# Patient Record
Sex: Female | Born: 1995 | Race: Black or African American | Hispanic: No | Marital: Married | Smoking: Never smoker
Health system: Southern US, Community
[De-identification: ages and names within clinical notes are randomized; demographics above are authoritative.]

## PROBLEM LIST (undated history)

## (undated) DIAGNOSIS — I10 Essential (primary) hypertension: Secondary | ICD-10-CM

## (undated) DIAGNOSIS — E079 Disorder of thyroid, unspecified: Secondary | ICD-10-CM

## (undated) HISTORY — DX: Essential (primary) hypertension: I10

## (undated) HISTORY — DX: Disorder of thyroid, unspecified: E07.9

---

## 2016-09-18 DIAGNOSIS — E0500 Thyrotoxicosis with diffuse goiter without thyrotoxic crisis or storm: Secondary | ICD-10-CM | POA: Insufficient documentation

## 2017-01-16 HISTORY — PX: THYROIDECTOMY: SHX17

## 2018-02-27 ENCOUNTER — Ambulatory Visit: Payer: 59 | Admitting: Family Medicine

## 2018-02-27 ENCOUNTER — Encounter: Payer: Self-pay | Admitting: Family Medicine

## 2018-02-27 VITALS — BP 102/70 | HR 79 | Temp 98.5°F | Ht 62.0 in | Wt 204.1 lb

## 2018-02-27 DIAGNOSIS — E890 Postprocedural hypothyroidism: Secondary | ICD-10-CM

## 2018-02-27 DIAGNOSIS — L0292 Furuncle, unspecified: Secondary | ICD-10-CM | POA: Diagnosis not present

## 2018-02-27 DIAGNOSIS — L989 Disorder of the skin and subcutaneous tissue, unspecified: Secondary | ICD-10-CM | POA: Diagnosis not present

## 2018-02-27 DIAGNOSIS — K219 Gastro-esophageal reflux disease without esophagitis: Secondary | ICD-10-CM | POA: Diagnosis not present

## 2018-02-27 MED ORDER — LEVOTHYROXINE SODIUM 137 MCG PO TABS: 137.0000 ug | ORAL_TABLET | Freq: Every day | ORAL | 1 refills | Status: DC

## 2018-02-27 MED ORDER — OMEPRAZOLE 20 MG PO CPDR: 20.0000 mg | DELAYED_RELEASE_CAPSULE | Freq: Two times a day (BID) | ORAL | 1 refills | Status: DC

## 2018-02-27 NOTE — Progress Notes (Signed)
Chief Complaint  Patient presents with  . Establish Care       New Patient Visit SUBJECTIVE: HPI: Amanda Benitez is an 22 y.o.female who is being seen for establishing care.  Post nasal drainage Duration: 3 weeks  Associated symptoms: drainage in back of throat Denies: sinus congestion, sinus pain, rhinorrhea, itchy watery eyes, ear fullness, ear pain, ear drainage, sore throat, wheezing, shortness of breath, myalgia, cough and fevers Treatment to date: Sudafed, herbal tea, peppermint tea, Mucinex, Zyrtec; none of these were helpful Sometimes eating can make it worse Sick contacts: No  Over the past 3 months, she has noticed a skin lesion over her back.  It waxes and wanes in size.  There is no pain, itching, drainage, or new topicals.  She does not believe it is getting larger and there are no skin color changes over the top of it.  Over the past several days, she is also had a painful ingrown hair in her pubic region.  She does room and believes it started after that.  There is no drainage, spreading redness, or fevers associated with this.  She has a history of hypothyroidism.  She is currently on 137 mcg of Synthroid and has currently been out for around 1 month.  The last time her thyroid levels were checked showed she was low, however she was not on the medication.  Allergies  Allergen Reactions  . Doxycycline Anaphylaxis    Past Medical History:  Diagnosis Date  . Thyroid disease    Past Surgical History:  Procedure Laterality Date  . THYROIDECTOMY  01/16/2017   Social History   Socioeconomic History  . Marital status: Married  Tobacco Use  . Smoking status: Never Smoker  . Smokeless tobacco: Never Used  Substance and Sexual Activity  . Alcohol use: Yes    Frequency: Never    Comment: rarely  . Drug use: No   Family History  Problem Relation Age of Onset  . Hypertension Mother   . Glaucoma Mother   . Hypertension Father   . Hyperlipidemia Father   .  Hyperlipidemia Sister      Current Outpatient Medications:  .  levothyroxine (SYNTHROID, LEVOTHROID) 137 MCG tablet, Take 1 tablet (137 mcg total) by mouth daily before breakfast., Disp: 90 tablet, Rfl: 1 .  omeprazole (PRILOSEC) 20 MG capsule, Take 1 capsule (20 mg total) by mouth 2 (two) times daily before a meal., Disp: 60 capsule, Rfl: 1  ROS Const: Denies fevers  Respiratory: Denies dyspnea   OBJECTIVE: BP 102/70 (BP Location: Left Arm, Patient Position: Sitting, Cuff Size: Large)   Pulse 79   Temp 98.5 F (36.9 C) (Oral)   Ht 5\' 2"  (1.575 m)   Wt 204 lb 2 oz (92.6 kg)   SpO2 99%   BMI 37.33 kg/m   Constitutional: -  VS reviewed -  Well developed, well nourished, appears stated age -  No apparent distress  Psychiatric: -  Oriented to person, place, and time -  Memory intact -  Affect and mood normal -  Fluent conversation, good eye contact -  Judgment and insight age appropriate  Eye: -  Conjunctivae clear, no discharge -  Pupils symmetric, round, reactive to light  ENMT: -  MMM    Pharynx moist, no exudate, no erythema  Neck: -  No gross swelling, no palpable masses -  Thyroid midline, not enlarged, mobile, no palpable masses  Cardiovascular: -  RRR -  No LE edema  Respiratory: -  Normal respiratory effort, no accessory muscle use, no retraction -  Breath sounds equal, no wheezes, no ronchi, no crackles  Gastrointestinal: -  Bowel sounds normal -  No tenderness, no distention, no guarding, no masses  Skin: -  Pt examined w female chaperone -  Over back on R, approx T 9, there is an obliquely lying 3.5 cm x 2 cm hard lesion that is freely moveable and well circumscribed; there is no tenderness to palpation, skin changes over, drainage, fluctuance, or excoriation -On the left mons pubis area, there is an area of indurated skin with central scaling and mild hyperemia.  It is moderately tender to palpation, no discharge or fluctuance.   ASSESSMENT/PLAN: Postoperative  hypothyroidism  Skin lesion of back - Plan: Ambulatory referral to Dermatology  Furuncle  Gastroesophageal reflux disease, esophagitis presence not specified  Patient instructed to sign release of records form from her previous PCP. Refill hypothyroidism, follow-up in 6 weeks to recheck levels with being on the medicine consistently. Refer to dermatology for further evaluation of the skin lesion. For her furuncle, recommended triple antibiotic ointment twice daily for 10 days.  Warning signs and symptoms discussed. For her discharge in the throat, recommended treating for reflux as allergies should have responded by 1 week on Zyrtec.  If no better by follow-up, will consider intranasal corticosteroid. Patient should return in 6 weeks. The patient voiced understanding and agreement to the plan.   [place], DO 02/27/18  12:01 PM

## 2018-02-27 NOTE — Patient Instructions (Addendum)
If you do not hear anything about your referral in the next 1-2 weeks, call our office and ask for an update.  Omeprazole 20 mg twice daily for 4-6 weeks to see if it is helpful for the throat issue.  Neosporin or triple antibiotic ointment twice daily for 10 days over the ingrown hair area. Be extra careful when grooming.  If you do not hear anything about your referral in the next 1-2 weeks, call our office and ask for an update.

## 2018-02-27 NOTE — Progress Notes (Signed)
Pre visit review using our clinic review tool, if applicable. No additional management support is needed unless otherwise documented below in the visit note. 

## 2018-03-24 DIAGNOSIS — E890 Postprocedural hypothyroidism: Secondary | ICD-10-CM | POA: Diagnosis not present

## 2018-03-26 DIAGNOSIS — Z124 Encounter for screening for malignant neoplasm of cervix: Secondary | ICD-10-CM | POA: Diagnosis not present

## 2018-03-30 ENCOUNTER — Other Ambulatory Visit: Payer: Self-pay | Admitting: Obstetrics and Gynecology

## 2018-03-30 DIAGNOSIS — Z0000 Encounter for general adult medical examination without abnormal findings: Secondary | ICD-10-CM | POA: Diagnosis not present

## 2018-03-30 DIAGNOSIS — N979 Female infertility, unspecified: Secondary | ICD-10-CM

## 2018-04-02 ENCOUNTER — Encounter (HOSPITAL_COMMUNITY): Payer: Self-pay | Admitting: Radiology

## 2018-04-02 ENCOUNTER — Ambulatory Visit (HOSPITAL_COMMUNITY)
Admission: RE | Admit: 2018-04-02 | Discharge: 2018-04-02 | Disposition: A | Discharge status: Home / Self Care | Payer: 59 | Source: Ambulatory Visit | Attending: Obstetrics and Gynecology | Admitting: Obstetrics and Gynecology

## 2018-04-02 DIAGNOSIS — Z3141 Encounter for fertility testing: Secondary | ICD-10-CM | POA: Diagnosis not present

## 2018-04-02 DIAGNOSIS — N979 Female infertility, unspecified: Secondary | ICD-10-CM | POA: Diagnosis present

## 2018-04-02 MED ORDER — IOPAMIDOL (ISOVUE-300) INJECTION 61%
30.0000 mL | Freq: Once | INTRAVENOUS | Status: AC | PRN
  Administered 2018-04-02: 6 mL

## 2018-04-10 ENCOUNTER — Ambulatory Visit: Payer: 59 | Admitting: Family Medicine

## 2018-04-16 DIAGNOSIS — D179 Benign lipomatous neoplasm, unspecified: Secondary | ICD-10-CM | POA: Diagnosis not present

## 2018-04-27 DIAGNOSIS — Z124 Encounter for screening for malignant neoplasm of cervix: Secondary | ICD-10-CM | POA: Diagnosis not present

## 2018-05-04 ENCOUNTER — Other Ambulatory Visit: Payer: Self-pay | Admitting: Family Medicine

## 2018-05-04 DIAGNOSIS — K219 Gastro-esophageal reflux disease without esophagitis: Secondary | ICD-10-CM

## 2018-05-04 MED ORDER — OMEPRAZOLE 20 MG PO CPDR: 20.0000 mg | DELAYED_RELEASE_CAPSULE | Freq: Two times a day (BID) | ORAL | 1 refills | Status: DC

## 2018-06-01 DIAGNOSIS — R222 Localized swelling, mass and lump, trunk: Secondary | ICD-10-CM | POA: Diagnosis not present

## 2018-06-04 DIAGNOSIS — D171 Benign lipomatous neoplasm of skin and subcutaneous tissue of trunk: Secondary | ICD-10-CM | POA: Diagnosis not present

## 2018-06-04 DIAGNOSIS — D216 Benign neoplasm of connective and other soft tissue of trunk, unspecified: Secondary | ICD-10-CM | POA: Diagnosis not present

## 2018-06-04 DIAGNOSIS — D219 Benign neoplasm of connective and other soft tissue, unspecified: Secondary | ICD-10-CM | POA: Diagnosis not present

## 2018-07-06 DIAGNOSIS — Z760 Encounter for issue of repeat prescription: Secondary | ICD-10-CM | POA: Diagnosis not present

## 2018-07-06 DIAGNOSIS — Z8639 Personal history of other endocrine, nutritional and metabolic disease: Secondary | ICD-10-CM | POA: Insufficient documentation

## 2018-07-06 DIAGNOSIS — E039 Hypothyroidism, unspecified: Secondary | ICD-10-CM | POA: Diagnosis not present

## 2018-07-07 DIAGNOSIS — Z6838 Body mass index (BMI) 38.0-38.9, adult: Secondary | ICD-10-CM | POA: Diagnosis not present

## 2018-07-07 DIAGNOSIS — E559 Vitamin D deficiency, unspecified: Secondary | ICD-10-CM | POA: Diagnosis not present

## 2018-07-07 DIAGNOSIS — N970 Female infertility associated with anovulation: Secondary | ICD-10-CM | POA: Diagnosis not present

## 2018-07-16 DIAGNOSIS — E890 Postprocedural hypothyroidism: Secondary | ICD-10-CM | POA: Diagnosis not present

## 2018-07-24 DIAGNOSIS — Z3169 Encounter for other general counseling and advice on procreation: Secondary | ICD-10-CM | POA: Diagnosis not present

## 2018-07-30 DIAGNOSIS — R109 Unspecified abdominal pain: Secondary | ICD-10-CM | POA: Diagnosis not present

## 2018-07-30 DIAGNOSIS — M549 Dorsalgia, unspecified: Secondary | ICD-10-CM | POA: Diagnosis not present

## 2018-07-30 DIAGNOSIS — R350 Frequency of micturition: Secondary | ICD-10-CM | POA: Diagnosis not present

## 2018-08-10 DIAGNOSIS — N970 Female infertility associated with anovulation: Secondary | ICD-10-CM | POA: Diagnosis not present

## 2018-08-24 DIAGNOSIS — N970 Female infertility associated with anovulation: Secondary | ICD-10-CM | POA: Diagnosis not present

## 2018-08-27 DIAGNOSIS — S161XXA Strain of muscle, fascia and tendon at neck level, initial encounter: Secondary | ICD-10-CM | POA: Diagnosis not present

## 2018-08-27 DIAGNOSIS — S63502A Unspecified sprain of left wrist, initial encounter: Secondary | ICD-10-CM | POA: Diagnosis not present

## 2018-09-08 DIAGNOSIS — S335XXD Sprain of ligaments of lumbar spine, subsequent encounter: Secondary | ICD-10-CM | POA: Diagnosis not present

## 2018-09-08 DIAGNOSIS — S63502D Unspecified sprain of left wrist, subsequent encounter: Secondary | ICD-10-CM | POA: Diagnosis not present

## 2018-09-08 DIAGNOSIS — S161XXD Strain of muscle, fascia and tendon at neck level, subsequent encounter: Secondary | ICD-10-CM | POA: Diagnosis not present

## 2018-09-18 ENCOUNTER — Encounter: Payer: Self-pay | Admitting: Family Medicine

## 2018-09-18 ENCOUNTER — Ambulatory Visit (INDEPENDENT_AMBULATORY_CARE_PROVIDER_SITE_OTHER): Payer: BLUE CROSS/BLUE SHIELD | Admitting: Family Medicine

## 2018-09-18 ENCOUNTER — Ambulatory Visit (HOSPITAL_BASED_OUTPATIENT_CLINIC_OR_DEPARTMENT_OTHER)
Admission: RE | Admit: 2018-09-18 | Discharge: 2018-09-18 | Disposition: A | Discharge status: Home / Self Care | Payer: BLUE CROSS/BLUE SHIELD | Source: Ambulatory Visit | Attending: Family Medicine | Admitting: Family Medicine

## 2018-09-18 VITALS — BP 102/70 | HR 64 | Temp 98.7°F | Ht 62.0 in | Wt 206.3 lb

## 2018-09-18 DIAGNOSIS — M25532 Pain in left wrist: Secondary | ICD-10-CM

## 2018-09-18 DIAGNOSIS — M545 Low back pain, unspecified: Secondary | ICD-10-CM

## 2018-09-18 DIAGNOSIS — M542 Cervicalgia: Secondary | ICD-10-CM | POA: Diagnosis not present

## 2018-09-18 NOTE — Progress Notes (Signed)
Pre visit review using our clinic review tool, if applicable. No additional management support is needed unless otherwise documented below in the visit note. 

## 2018-09-18 NOTE — Patient Instructions (Signed)
We will be in contact regarding your X-ray.  Heat (pad or rice pillow in microwave) over affected area, 10-15 minutes twice daily.   Ice/cold pack over area for 10-15 min twice daily.  OK to take Tylenol 1000 mg (2 extra strength tabs) or 975 mg (3 regular strength tabs) every 6 hours as needed.  If no improvement with your wrist or back in the next several weeks, let me know.  If not able to do the stretches/exercises for the neck, let me know and we will set you up with physical therapy.  I don't think ordering an MRI is going to be a covered imaging test at this time. If anything changes, let us know.  EXERCISES RANGE OF MOTION (ROM) AND STRETCHING EXERCISES  These exercises may help you when beginning to rehabilitate your issue. In order to successfully resolve your symptoms, you must improve your posture. These exercises are designed to help reduce the forward-head and rounded-shoulder posture which contributes to this condition. Your symptoms may resolve with or without further involvement from your physician, physical therapist or athletic trainer. While completing these exercises, remember:   Restoring tissue flexibility helps normal motion to return to the joints. This allows healthier, less painful movement and activity.  An effective stretch should be held for at least 20 seconds, although you may need to begin with shorter hold times for comfort.  A stretch should never be painful. You should only feel a gentle lengthening or release in the stretched tissue.  Do not do any stretch or exercise that you cannot tolerate.  STRETCH- Axial Extensors  Lie on your back on the floor. You may bend your knees for comfort. Place a rolled-up hand towel or dish towel, about 2 inches in diameter, under the part of your head that makes contact with the floor.  Gently tuck your chin, as if trying to make a "double chin," until you feel a gentle stretch at the base of your head.  Hold 15-20  seconds. Repeat 2-3 times. Complete this exercise 1 time per day.   STRETCH - Axial Extension   Stand or sit on a firm surface. Assume a good posture: chest up, shoulders drawn back, abdominal muscles slightly tense, knees unlocked (if standing) and feet hip width apart.  Slowly retract your chin so your head slides back and your chin slightly lowers. Continue to look straight ahead.  You should feel a gentle stretch in the back of your head. Be certain not to feel an aggressive stretch since this can cause headaches later.  Hold for 15-20 seconds. Repeat 2-3 times. Complete this exercise 1 time per day.  STRETCH - Cervical Side Bend   Stand or sit on a firm surface. Assume a good posture: chest up, shoulders drawn back, abdominal muscles slightly tense, knees unlocked (if standing) and feet hip width apart.  Without letting your nose or shoulders move, slowly tip your right / left ear to your shoulder until your feel a gentle stretch in the muscles on the opposite side of your neck.  Hold 15-20 seconds. Repeat 2-3 times. Complete this exercise 1-2 times per day.  STRETCH - Cervical Rotators   Stand or sit on a firm surface. Assume a good posture: chest up, shoulders drawn back, abdominal muscles slightly tense, knees unlocked (if standing) and feet hip width apart.  Keeping your eyes level with the ground, slowly turn your head until you feel a gentle stretch along the back and opposite side of your  neck.  Hold 15-20 seconds. Repeat 2-3 times. Complete this exercise 1-2 times per day.  RANGE OF MOTION - Neck Circles   Stand or sit on a firm surface. Assume a good posture: chest up, shoulders drawn back, abdominal muscles slightly tense, knees unlocked (if standing) and feet hip width apart.  Gently roll your head down and around from the back of one shoulder to the back of the other. The motion should never be forced or painful.  Repeat the motion 10-20 times, or until you feel  the neck muscles relax and loosen. Repeat 2-3 times. Complete the exercise 1-2 times per day. STRENGTHENING EXERCISES - Cervical Strain and Sprain These exercises may help you when beginning to rehabilitate your injury. They may resolve your symptoms with or without further involvement from your physician, physical therapist, or athletic trainer. While completing these exercises, remember:   Muscles can gain both the endurance and the strength needed for everyday activities through controlled exercises.  Complete these exercises as instructed by your physician, physical therapist, or athletic trainer. Progress the resistance and repetitions only as guided.  You may experience muscle soreness or fatigue, but the pain or discomfort you are trying to eliminate should never worsen during these exercises. If this pain does worsen, stop and make certain you are following the directions exactly. If the pain is still present after adjustments, discontinue the exercise until you can discuss the trouble with your clinician.  STRENGTH - Cervical Flexors, Isometric  Face a wall, standing about 6 inches away. Place a small pillow, a ball about 6-8 inches in diameter, or a folded towel between your forehead and the wall.  Slightly tuck your chin and gently push your forehead into the soft object. Push only with mild to moderate intensity, building up tension gradually. Keep your jaw and forehead relaxed.  Hold 10 to 20 seconds. Keep your breathing relaxed.  Release the tension slowly. Relax your neck muscles completely before you start the next repetition. Repeat 2-3 times. Complete this exercise 1 time per day.  STRENGTH- Cervical Lateral Flexors, Isometric   Stand about 6 inches away from a wall. Place a small pillow, a ball about 6-8 inches in diameter, or a folded towel between the side of your head and the wall.  Slightly tuck your chin and gently tilt your head into the soft object. Push only with  mild to moderate intensity, building up tension gradually. Keep your jaw and forehead relaxed.  Hold 10 to 20 seconds. Keep your breathing relaxed.  Release the tension slowly. Relax your neck muscles completely before you start the next repetition. Repeat 2-3 times. Complete this exercise 1 time per day.  STRENGTH - Cervical Extensors, Isometric   Stand about 6 inches away from a wall. Place a small pillow, a ball about 6-8 inches in diameter, or a folded towel between the back of your head and the wall.  Slightly tuck your chin and gently tilt your head back into the soft object. Push only with mild to moderate intensity, building up tension gradually. Keep your jaw and forehead relaxed.  Hold 10 to 20 seconds. Keep your breathing relaxed.  Release the tension slowly. Relax your neck muscles completely before you start the next repetition. Repeat 2-3 times. Complete this exercise 1 time per day.  POSTURE AND BODY MECHANICS CONSIDERATIONS Keeping correct posture when sitting, standing or completing your activities will reduce the stress put on different body tissues, allowing injured tissues a chance to heal and  limiting painful experiences. The following are general guidelines for improved posture. Your physician or physical therapist will provide you with any instructions specific to your needs. While reading these guidelines, remember:  The exercises prescribed by your provider will help you have the flexibility and strength to maintain correct postures.  The correct posture provides the optimal environment for your joints to work. All of your joints have less wear and tear when properly supported by a spine with good posture. This means you will experience a healthier, less painful body.  Correct posture must be practiced with all of your activities, especially prolonged sitting and standing. Correct posture is as important when doing repetitive low-stress activities (typing) as it is  when doing a single heavy-load activity (lifting).  PROLONGED STANDING WHILE SLIGHTLY LEANING FORWARD When completing a task that requires you to lean forward while standing in one place for a long time, place either foot up on a stationary 2- to 4-inch high object to help maintain the best posture. When both feet are on the ground, the low back tends to lose its slight inward curve. If this curve flattens (or becomes too large), then the back and your other joints will experience too much stress, fatigue more quickly, and can cause pain.   RESTING POSITIONS Consider which positions are most painful for you when choosing a resting position. If you have pain with flexion-based activities (sitting, bending, stooping, squatting), choose a position that allows you to rest in a less flexed posture. You would want to avoid curling into a fetal position on your side. If your pain worsens with extension-based activities (prolonged standing, working overhead), avoid resting in an extended position such as sleeping on your stomach. Most people will find more comfort when they rest with their spine in a more neutral position, neither too rounded nor too arched. Lying on a non-sagging bed on your side with a pillow between your knees, or on your back with a pillow under your knees will often provide some relief. Keep in mind, being in any one position for a prolonged period of time, no matter how correct your posture, can still lead to stiffness.  WALKING Walk with an upright posture. Your ears, shoulders, and hips should all line up. OFFICE WORK When working at a desk, create an environment that supports good, upright posture. Without extra support, muscles fatigue and lead to excessive strain on joints and other tissues.  CHAIR:  A chair should be able to slide under your desk when your back makes contact with the back of the chair. This allows you to work closely.  The chair's height should allow your eyes  to be level with the upper part of your monitor and your hands to be slightly lower than your elbows.  Body position: ? Your feet should make contact with the floor. If this is not possible, use a foot rest. ? Keep your ears over your shoulders. This will reduce stress on your neck and low back.    Wrist and Forearm Exercises Do exercises exactly as told by your health care provider and adjust them as directed. It is normal to feel mild stretching, pulling, tightness, or discomfort as you do these exercises, but you should stop right away if you feel sudden pain or your pain gets worse.   RANGE OF MOTION EXERCISES These exercises warm up your muscles and joints and improve the movement and flexibility of your injured wrist and forearm. These exercises also help to relieve  pain, numbness, and tingling. These exercises are done using the muscles in your injured wrist and forearm. Exercise A: Wrist Flexion, Active 1. With your fingers relaxed, bend your wrist forward as far as you can. 2. Hold this position for 30 seconds. Repeat 2 times. Complete this exercise 3 times per week. Exercise B: Wrist Extension, Active 1. With your fingers relaxed, bend your wrist backward as far as you can. 2. Hold this position for 30 seconds. Repeat 2 times. Complete this exercise 3 times per week. Exercise C: Supination, Active  1. Stand or sit with your arms at your sides. 2. Bend your left / right elbow to an "L" shape (90 degrees). 3. Turn your palm upward until you feel a gentle stretch on the inside of your forearm. 4. Hold this position for 30 seconds. 5. Slowly return your palm to the starting position. Repeat 2 times. Complete this exercise 3 times per week. Exercise D: Pronation, Active  1. Stand or sit with your arms at your sides. 2. Bend your left / right elbow to an "L" shape (90 degrees). 3. Turn your palm downward until you feel a gentle stretch on the top of your forearm. 4. Hold this  position for 30 seconds. 5. Slowly return your palm to the starting position. Repeat 2 times. Complete this exercise once a day.  STRETCHING EXERCISES These exercises warm up your muscles and joints and improve the movement and flexibility of your injured wrist and forearm. These exercises also help to relieve pain, numbness, and tingling. These exercises are done using your healthy wrist and forearm to help stretch the muscles in your injured wrist and forearm. Exercise E: Wrist Flexion, Passive  1. Extend your left / right arm in front of you, relax your wrist, and point your fingers downward. 2. Gently push on the back of your hand. Stop when you feel a gentle stretch on the top of your forearm. 3. Hold this position for 30 seconds. Repeat 2 times. Complete this exercise 3 times per week. Exercise F: Wrist Extension, Passive  1. Extend your left / right arm in front of you and turn your palm upward. 2. Gently pull your palm and fingertips back so your fingers point downward. You should feel a gentle stretch on the palm-side of your forearm. 3. Hold this position for 30 seconds. Repeat 2 times. Complete this exercise 3 times per week. Exercise G: Forearm Rotation, Supination, Passive 1. Sit with your left / right elbow bent to an "L" shape (90 degrees) with your forearm resting on a table. 2. Keeping your upper body and shoulder still, use your other hand to rotate your forearm palm-up until you feel a gentle to moderate stretch. 3. Hold this position for 30 seconds. 4. Slowly release the stretch and return to the starting position. Repeat 2 times. Complete this exercise 3 times per week. Exercise H: Forearm Rotation, Pronation, Passive 1. Sit with your left / right elbow bent to an "L" shape (90 degrees) with your forearm resting on a table. 2. Keeping your upper body and shoulder still, use your other hand to rotate your forearm palm-down until you feel a gentle to moderate  stretch. 3. Hold this position for 30 seconds. 4. Slowly release the stretch and return to the starting position. Repeat 2 times. Complete this exercise 3 times per week.  STRENGTHENING EXERCISES These exercises build strength and endurance in your wrist and forearm. Endurance is the ability to use your muscles for a  long time, even after they get tired. Exercise I: Wrist Flexors  1. Sit with your left / right forearm supported on a table and your hand resting palm-up over the edge of the table. Your elbow should be bent to an "L" shape (about 90 degrees) and be below the level of your shoulder. 2. Hold a 3-5 lb weight in your left / right hand. Or, hold a rubber exercise band or tube in both hands, keeping your hands at the same level and hip distance apart. There should be a slight tension in the exercise band or tube. 3. Slowly curl your hand up toward your forearm. 4. Hold this position for 3 seconds. 5. Slowly lower your hand back to the starting position. Repeat 2 times. Complete this exercise 3 times per week. Exercise J: Wrist Extensors  1. Sit with your left / right forearm supported on a table and your hand resting palm-down over the edge of the table. Your elbow should be bent to an "L" shape (about 90 degrees) and be below the level of your shoulder. 2. Hold a 3-5 lb weight in your left / right hand. Or, hold a rubber exercise band or tube in both hands, keeping your hands at the same level and hip distance apart. There should be a slight tension in the exercise band or tube. 3. Slowly curl your hand up toward your forearm. 4. Hold this position for 3 seconds. 5. Slowly lower your hand back to the starting position. Repeat 2 times. Complete this exercise 3 times per week. Exercise K: Forearm Rotation, Supination  1. Sit with your left / right forearm supported on a table and your hand resting palm-down. Your elbow should be at your side, bent to an "L" shape (about 90 degrees),  and below the level of your shoulder. Keep your wrist stable and in a neutral position throughout the exercise. 2. Gently hold a lightweight hammer with your left / right hand. 3. Without moving your elbow or wrist, slowly rotate your palm upward to a thumbs-up position. 4. Hold this position for 3 seconds. 5. Slowly return your forearm to the starting position. Repeat 2 times. Complete this exercise 3 times per week. Exercise L: Forearm Rotation, Pronation  1. Sit with your left / right forearm supported on a table and your hand resting palm-up. Your elbow should be at your side, bent to an "L" shape (about 90 degrees), and below the level of your shoulder. Keep your wrist stable. Do not allow it to move backward or forward during the exercise. 2. Gently hold a lightweight hammer with your left / right hand. 3. Without moving your elbow or wrist, slowly rotate your palm and hand upward to a thumbs-up position. 4. Hold this position for 3 seconds. 5. Slowly return your forearm to the starting position. Repeat 2 times. Complete this exercise 3 times per week. Exercise M: Grip Strengthening  1. Hold one of these items in your left / right hand: play dough, therapy putty, a dense sponge, a stress ball, or a large, rolled sock. 2. Squeeze as hard as you can without increasing pain. 3. Hold this position for 5 seconds. 4. Slowly release your grip. Repeat 2 times. Complete this exercise 3 times per week.  This information is not intended to replace advice given to you by your health care provider. Make sure you discuss any questions you have with your health care provider. Document Released: 10/16/2005 Document Revised: 08/26/2016 Document Reviewed: 08/27/2015 [employer]  Interactive Patient Education  2018 [employer] (ROM) AND STRETCHING EXERCISES - Low Back Pain Most people with lower back pain will find that their symptoms get worse with excessive bending forward  (flexion) or arching at the lower back (extension). The exercises that will help resolve your symptoms will focus on the opposite motion.  If you have pain, numbness or tingling which travels down into your buttocks, leg or foot, the goal of the therapy is for these symptoms to move closer to your back and eventually resolve. Sometimes, these leg symptoms will get better, but your lower back pain may worsen. This is often an indication of progress in your rehabilitation. Be very alert to any changes in your symptoms and the activities in which you participated in the 24 hours prior to the change. Sharing this information with your caregiver will allow him or her to most efficiently treat your condition. These exercises may help you when beginning to rehabilitate your injury. Your symptoms may resolve with or without further involvement from your physician, physical therapist or athletic trainer. While completing these exercises, remember:   Restoring tissue flexibility helps normal motion to return to the joints. This allows healthier, less painful movement and activity.  An effective stretch should be held for at least 30 seconds.  A stretch should never be painful. You should only feel a gentle lengthening or release in the stretched tissue. FLEXION RANGE OF MOTION AND STRETCHING EXERCISES:  STRETCH - Flexion, Single Knee to Chest   Lie on a firm bed or floor with both legs extended in front of you.  Keeping one leg in contact with the floor, bring your opposite knee to your chest. Hold your leg in place by either grabbing behind your thigh or at your knee.  Pull until you feel a gentle stretch in your low back. Hold 30 seconds.  Slowly release your grasp and repeat the exercise with the opposite side. Repeat 2 times. Complete this exercise 3 times per week.   STRETCH - Flexion, Double Knee to Chest  Lie on a firm bed or floor with both legs extended in front of you.  Keeping one leg in  contact with the floor, bring your opposite knee to your chest.  Tense your stomach muscles to support your back and then lift your other knee to your chest. Hold your legs in place by either grabbing behind your thighs or at your knees.  Pull both knees toward your chest until you feel a gentle stretch in your low back. Hold 30 seconds.  Tense your stomach muscles and slowly return one leg at a time to the floor. Repeat 2 times. Complete this exercise 3 times per week.   STRETCH - Low Trunk Rotation  Lie on a firm bed or floor. Keeping your legs in front of you, bend your knees so they are both pointed toward the ceiling and your feet are flat on the floor.  Extend your arms out to the side. This will stabilize your upper body by keeping your shoulders in contact with the floor.  Gently and slowly drop both knees together to one side until you feel a gentle stretch in your low back. Hold for 30 seconds.  Tense your stomach muscles to support your lower back as you bring your knees back to the starting position. Repeat the exercise to the other side. Repeat 2 times. Complete this exercise at least 3 times per week.   EXTENSION  RANGE OF MOTION AND FLEXIBILITY EXERCISES:  STRETCH - Extension, Prone on Elbows   Lie on your stomach on the floor, a bed will be too soft. Place your palms about shoulder width apart and at the height of your head.  Place your elbows under your shoulders. If this is too painful, stack pillows under your chest.  Allow your body to relax so that your hips drop lower and make contact more completely with the floor.  Hold this position for 30 seconds.  Slowly return to lying flat on the floor. Repeat 2 times. Complete this exercise 3 times per week.   RANGE OF MOTION - Extension, Prone Press Ups  Lie on your stomach on the floor, a bed will be too soft. Place your palms about shoulder width apart and at the height of your head.  Keeping your back as relaxed  as possible, slowly straighten your elbows while keeping your hips on the floor. You may adjust the placement of your hands to maximize your comfort. As you gain motion, your hands will come more underneath your shoulders.  Hold this position 30 seconds.  Slowly return to lying flat on the floor. Repeat 2 times. Complete this exercise 3 times per week.   RANGE OF MOTION- Quadruped, Neutral Spine   Assume a hands and knees position on a firm surface. Keep your hands under your shoulders and your knees under your hips. You may place padding under your knees for comfort.  Drop your head and point your tailbone toward the ground below you. This will round out your lower back like an angry cat. Hold this position for 30 seconds.  Slowly lift your head and release your tail bone so that your back sags into a large arch, like an old horse.  Hold this position for 30 seconds.  Repeat this until you feel limber in your low back.  Now, find your "sweet spot." This will be the most comfortable position somewhere between the two previous positions. This is your neutral spine. Once you have found this position, tense your stomach muscles to support your low back.  Hold this position for 30 seconds. Repeat 2 times. Complete this exercise 3 times per week.   STRENGTHENING EXERCISES - Low Back Sprain These exercises may help you when beginning to rehabilitate your injury. These exercises should be done near your "sweet spot." This is the neutral, low-back arch, somewhere between fully rounded and fully arched, that is your least painful position. When performed in this safe range of motion, these exercises can be used for people who have either a flexion or extension based injury. These exercises may resolve your symptoms with or without further involvement from your physician, physical therapist or athletic trainer. While completing these exercises, remember:   Muscles can gain both the endurance and the  strength needed for everyday activities through controlled exercises.  Complete these exercises as instructed by your physician, physical therapist or athletic trainer. Increase the resistance and repetitions only as guided.  You may experience muscle soreness or fatigue, but the pain or discomfort you are trying to eliminate should never worsen during these exercises. If this pain does worsen, stop and make certain you are following the directions exactly. If the pain is still present after adjustments, discontinue the exercise until you can discuss the trouble with your caregiver.  STRENGTHENING - Deep Abdominals, Pelvic Tilt   Lie on a firm bed or floor. Keeping your legs in front of you, bend your  knees so they are both pointed toward the ceiling and your feet are flat on the floor.  Tense your lower abdominal muscles to press your low back into the floor. This motion will rotate your pelvis so that your tail bone is scooping upwards rather than pointing at your feet or into the floor. With a gentle tension and even breathing, hold this position for 3 seconds. Repeat 2 times. Complete this exercise 3 times per week.   STRENGTHENING - Abdominals, Crunches   Lie on a firm bed or floor. Keeping your legs in front of you, bend your knees so they are both pointed toward the ceiling and your feet are flat on the floor. Cross your arms over your chest.  Slightly tip your chin down without bending your neck.  Tense your abdominals and slowly lift your trunk high enough to just clear your shoulder blades. Lifting higher can put excessive stress on the lower back and does not further strengthen your abdominal muscles.  Control your return to the starting position. Repeat 2 times. Complete this exercise 3 times per week.   STRENGTHENING - Quadruped, Opposite UE/LE Lift   Assume a hands and knees position on a firm surface. Keep your hands under your shoulders and your knees under your hips. You  may place padding under your knees for comfort.  Find your neutral spine and gently tense your abdominal muscles so that you can maintain this position. Your shoulders and hips should form a rectangle that is parallel with the floor and is not twisted.  Keeping your trunk steady, lift your right hand no higher than your shoulder and then your left leg no higher than your hip. Make sure you are not holding your breath. Hold this position for 30 seconds.  Continuing to keep your abdominal muscles tense and your back steady, slowly return to your starting position. Repeat with the opposite arm and leg. Repeat 2 times. Complete this exercise 3 times per week.   STRENGTHENING - Abdominals and Quadriceps, Straight Leg Raise   Lie on a firm bed or floor with both legs extended in front of you.  Keeping one leg in contact with the floor, bend the other knee so that your foot can rest flat on the floor.  Find your neutral spine, and tense your abdominal muscles to maintain your spinal position throughout the exercise.  Slowly lift your straight leg off the floor about 6 inches for a count of 3, making sure to not hold your breath.  Still keeping your neutral spine, slowly lower your leg all the way to the floor. Repeat this exercise with each leg 2 times. Complete this exercise 3 times per week.  POSTURE AND BODY MECHANICS CONSIDERATIONS - Low Back Sprain Keeping correct posture when sitting, standing or completing your activities will reduce the stress put on different body tissues, allowing injured tissues a chance to heal and limiting painful experiences. The following are general guidelines for improved posture.  While reading these guidelines, remember:  The exercises prescribed by your provider will help you have the flexibility and strength to maintain correct postures.  The correct posture provides the best environment for your joints to work. All of your joints have less wear and tear when  properly supported by a spine with good posture. This means you will experience a healthier, less painful body.  Correct posture must be practiced with all of your activities, especially prolonged sitting and standing. Correct posture is as important when doing  repetitive low-stress activities (typing) as it is when doing a single heavy-load activity (lifting).  RESTING POSITIONS Consider which positions are most painful for you when choosing a resting position. If you have pain with flexion-based activities (sitting, bending, stooping, squatting), choose a position that allows you to rest in a less flexed posture. You would want to avoid curling into a fetal position on your side. If your pain worsens with extension-based activities (prolonged standing, working overhead), avoid resting in an extended position such as sleeping on your stomach. Most people will find more comfort when they rest with their spine in a more neutral position, neither too rounded nor too arched. Lying on a non-sagging bed on your side with a pillow between your knees, or on your back with a pillow under your knees will often provide some relief. Keep in mind, being in any one position for a prolonged period of time, no matter how correct your posture, can still lead to stiffness.  PROPER SITTING POSTURE In order to minimize stress and discomfort on your spine, you must sit with correct posture. Sitting with good posture should be effortless for a healthy body. Returning to good posture is a gradual process. Many people can work toward this most comfortably by using various supports until they have the flexibility and strength to maintain this posture on their own. When sitting with proper posture, your ears will fall over your shoulders and your shoulders will fall over your hips. You should use the back of the chair to support your upper back. Your lower back will be in a neutral position, just slightly arched. You may place a  small pillow or folded towel at the base of your lower back for  support.  When working at a desk, create an environment that supports good, upright posture. Without extra support, muscles tire, which leads to excessive strain on joints and other tissues. Keep these recommendations in mind:  CHAIR:  A chair should be able to slide under your desk when your back makes contact with the back of the chair. This allows you to work closely.  The chair's height should allow your eyes to be level with the upper part of your monitor and your hands to be slightly lower than your elbows.  BODY POSITION  Your feet should make contact with the floor. If this is not possible, use a foot rest.  Keep your ears over your shoulders. This will reduce stress on your neck and low back.  INCORRECT SITTING POSTURES  If you are feeling tired and unable to assume a healthy sitting posture, do not slouch or slump. This puts excessive strain on your back tissues, causing more damage and pain. Healthier options include:  Using more support, like a lumbar pillow.  Switching tasks to something that requires you to be upright or walking.  Talking a brief walk.  Lying down to rest in a neutral-spine position.  PROLONGED STANDING WHILE SLIGHTLY LEANING FORWARD  When completing a task that requires you to lean forward while standing in one place for a long time, place either foot up on a stationary 2-4 inch high object to help maintain the best posture. When both feet are on the ground, the lower back tends to lose its slight inward curve. If this curve flattens (or becomes too large), then the back and your other joints will experience too much stress, tire more quickly, and can cause pain.  CORRECT STANDING POSTURES Proper standing posture should be assumed with  all daily activities, even if they only take a few moments, like when brushing your teeth. As in sitting, your ears should fall over your shoulders and your  shoulders should fall over your hips. You should keep a slight tension in your abdominal muscles to brace your spine. Your tailbone should point down to the ground, not behind your body, resulting in an over-extended swayback posture.   INCORRECT STANDING POSTURES  Common incorrect standing postures include a forward head, locked knees and/or an excessive swayback. WALKING Walk with an upright posture. Your ears, shoulders and hips should all line-up.  PROLONGED ACTIVITY IN A FLEXED POSITION When completing a task that requires you to bend forward at your waist or lean over a low surface, try to find a way to stabilize 3 out of 4 of your limbs. You can place a hand or elbow on your thigh or rest a knee on the surface you are reaching across. This will provide you more stability, so that your muscles do not tire as quickly. By keeping your knees relaxed, or slightly bent, you will also reduce stress across your lower back. CORRECT LIFTING TECHNIQUES  DO :  Assume a wide stance. This will provide you more stability and the opportunity to get as close as possible to the object which you are lifting.  Tense your abdominals to brace your spine. Bend at the knees and hips. Keeping your back locked in a neutral-spine position, lift using your leg muscles. Lift with your legs, keeping your back straight.  Test the weight of unknown objects before attempting to lift them.  Try to keep your elbows locked down at your sides in order get the best strength from your shoulders when carrying an object.     Always ask for help when lifting heavy or awkward objects. INCORRECT LIFTING TECHNIQUES DO NOT:   Lock your knees when lifting, even if it is a small object.  Bend and twist. Pivot at your feet or move your feet when needing to change directions.  Assume that you can safely pick up even a paperclip without proper posture.
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
## 2018-10-08 DIAGNOSIS — E890 Postprocedural hypothyroidism: Secondary | ICD-10-CM | POA: Diagnosis not present

## 2019-02-01 DIAGNOSIS — L600 Ingrowing nail: Secondary | ICD-10-CM | POA: Diagnosis not present

## 2019-02-01 DIAGNOSIS — M25774 Osteophyte, right foot: Secondary | ICD-10-CM | POA: Diagnosis not present

## 2019-02-08 DIAGNOSIS — E890 Postprocedural hypothyroidism: Secondary | ICD-10-CM | POA: Diagnosis not present

## 2019-04-13 ENCOUNTER — Encounter (HOSPITAL_BASED_OUTPATIENT_CLINIC_OR_DEPARTMENT_OTHER): Payer: Self-pay | Admitting: *Deleted

## 2019-04-13 ENCOUNTER — Emergency Department (HOSPITAL_BASED_OUTPATIENT_CLINIC_OR_DEPARTMENT_OTHER)
Admission: EM | Admit: 2019-04-13 | Discharge: 2019-04-13 | Disposition: A | Discharge status: Home / Self Care | Payer: BLUE CROSS/BLUE SHIELD | Attending: Emergency Medicine | Admitting: Emergency Medicine

## 2019-04-13 ENCOUNTER — Other Ambulatory Visit: Payer: Self-pay

## 2019-04-13 DIAGNOSIS — E039 Hypothyroidism, unspecified: Secondary | ICD-10-CM | POA: Insufficient documentation

## 2019-04-13 DIAGNOSIS — I10 Essential (primary) hypertension: Secondary | ICD-10-CM | POA: Diagnosis not present

## 2019-04-13 DIAGNOSIS — R002 Palpitations: Secondary | ICD-10-CM | POA: Diagnosis not present

## 2019-04-13 DIAGNOSIS — Z79899 Other long term (current) drug therapy: Secondary | ICD-10-CM | POA: Insufficient documentation

## 2019-04-13 DIAGNOSIS — Z91040 Latex allergy status: Secondary | ICD-10-CM | POA: Insufficient documentation

## 2019-04-13 LAB — CBC WITH DIFFERENTIAL/PLATELET
Abs Immature Granulocytes: 0.01 10*3/uL (ref 0.00–0.07)
Basophils Absolute: 0.1 10*3/uL (ref 0.0–0.1)
Basophils Relative: 1 %
Eosinophils Absolute: 0 10*3/uL (ref 0.0–0.5)
Eosinophils Relative: 0 %
HCT: 36.4 % (ref 36.0–46.0)
Hemoglobin: 11.3 g/dL — ABNORMAL LOW (ref 12.0–15.0)
Immature Granulocytes: 0 %
Lymphocytes Relative: 41 %
Lymphs Abs: 3.4 10*3/uL (ref 0.7–4.0)
MCH: 25.6 pg — ABNORMAL LOW (ref 26.0–34.0)
MCHC: 31 g/dL (ref 30.0–36.0)
MCV: 82.5 fL (ref 80.0–100.0)
Monocytes Absolute: 0.7 10*3/uL (ref 0.1–1.0)
Monocytes Relative: 8 %
Neutro Abs: 4.2 10*3/uL (ref 1.7–7.7)
Neutrophils Relative %: 50 %
Platelets: 396 10*3/uL (ref 150–400)
RBC: 4.41 MIL/uL (ref 3.87–5.11)
RDW: 16 % — ABNORMAL HIGH (ref 11.5–15.5)
WBC: 8.4 10*3/uL (ref 4.0–10.5)
nRBC: 0 % (ref 0.0–0.2)

## 2019-04-13 LAB — BASIC METABOLIC PANEL
Anion gap: 6 (ref 5–15)
BUN: 21 mg/dL — ABNORMAL HIGH (ref 6–20)
CO2: 23 mmol/L (ref 22–32)
Calcium: 9.3 mg/dL (ref 8.9–10.3)
Chloride: 105 mmol/L (ref 98–111)
Creatinine, Ser: 0.83 mg/dL (ref 0.44–1.00)
GFR calc Af Amer: >60 mL/min (ref >60)
GFR calc non Af Amer: >60 mL/min (ref >60)
Glucose, Bld: 81 mg/dL (ref 70–99)
Potassium: 3.9 mmol/L (ref 3.5–5.1)
Sodium: 134 mmol/L — ABNORMAL LOW (ref 135–145)

## 2019-04-13 NOTE — ED Triage Notes (Signed)
Pt c/o " palpations" x 2 weeks

## 2019-04-13 NOTE — ED Notes (Signed)
Pt on monitor 

## 2019-04-13 NOTE — Discharge Instructions (Signed)
Your hemoglobin was lower than normal but not so low that it should cause you symptoms.  You had no significant electrolyte abnormality.  Please call your family doctor tomorrow and discuss your visit here with them so they can fit you into the office to try and figure out the next best steps.  They may ask you to wear a Holter monitor to try and ensure that this is a continued sinus tachycardia.  Please return to the ED for passing out or if you start coughing up blood or start having chest pain or shortness of breath.

## 2019-04-13 NOTE — ED Notes (Signed)
Pt states thyroidectomy x 2 years ago. Denies fever, denies abdominal pain or sob

## 2019-04-13 NOTE — ED Provider Notes (Signed)
[facility] Provider Note   CSN: [id number] Arrival date & time: 04/13/19  1630    History   Chief Complaint Chief Complaint  Patient presents with  . Palpitations    HPI Amanda Benitez is a 23 y.o. female.     23 yo F with a chief complaint of palpitations.  This been going on for the past couple weeks.  She thinks she feels like when she was diagnosed with thyroid storm.  Not exertional.  No chest pain or pressure.  Just feels that her heart is racing and typically will wake her up out of sleep.  She denies caffeine use denies stimulant use.  She is on levothyroxine status post thyroid removal.  She was seen a couple days ago had an urgent care and had her thyroid labs checked that showed that she was somewhat hypothyroid.  She has not yet scheduled an appointment with her endocrinologist.  She denies any new diet medicine, denies any change in her diet.  Denies suspicious food intake denies cough congestion or fever denies vomiting or diarrhea denies hair loss denies weight loss denies heat intolerance.  The history is provided by the patient.  Palpitations  Associated symptoms: no chest pain, no dizziness, no nausea, no shortness of breath and no vomiting   Illness  Severity:  Mild Onset quality:  Sudden Duration:  2 weeks Timing:  Constant Progression:  Unchanged Chronicity:  New Associated symptoms: no chest pain, no congestion, no fever, no headaches, no myalgias, no nausea, no rhinorrhea, no shortness of breath, no vomiting and no wheezing     Past Medical History:  Diagnosis Date  . Hypertension   . Thyroid disease     Patient Active Problem List   Diagnosis Date Noted  . 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 THYROIDECTOMY  01/16/2017     OB History   No obstetric history on file.      Home Medications    Prior to Admission medications   Medication Sig Start  Date End Date Taking? Authorizing Provider  cyclobenzaprine (FLEXERIL) 10 MG tablet Take 10 mg by mouth as needed for muscle spasms.    [provider]  diclofenac (VOLTAREN) 75 MG EC tablet Take 75 mg by mouth 2 (two) times daily.    [provider]  levothyroxine (SYNTHROID, LEVOTHROID) 175 MCG tablet Take 175 mcg by mouth daily before breakfast.    [provider]  methocarbamol (ROBAXIN) 500 MG tablet Take 500 mg by mouth as needed for muscle spasms.    [provider]    Family History Family History  Problem Relation Age of Onset  . Hypertension Mother   . Glaucoma Mother   . Asthma Mother   . Hypertension Father   . Hyperlipidemia Father   . Hyperlipidemia Sister     Social History Social History   Tobacco Use  . Smoking status: Never Smoker  . Smokeless tobacco: Never Used  Substance Use Topics  . Alcohol use: Yes    Frequency: Never    Comment: rarely  . Drug use: No     Allergies   Doxycycline; Adhesive [tape]; Latex; and Minocycline   Review of Systems Review of Systems  Constitutional: Negative for chills and fever.  HENT: Negative for congestion and rhinorrhea.   Eyes: Negative for redness and visual disturbance.  Respiratory: Negative for shortness of breath and wheezing.   Cardiovascular: Positive for palpitations.  Negative for chest pain.  Gastrointestinal: Negative for nausea and vomiting.  Genitourinary: Negative for dysuria and urgency.  Musculoskeletal: Negative for arthralgias and myalgias.  Skin: Negative for pallor and wound.  Neurological: Negative for dizziness and headaches.     Physical Exam Updated Vital Signs BP 127/74   Pulse 97   Temp 99.7 F (37.6 C)   Resp 18   Ht 5\' 2"  (1.575 m)   Wt 95.3 kg   LMP 03/24/2019   SpO2 100%   BMI 38.41 kg/m   Physical Exam Vitals signs and nursing note reviewed.  Constitutional:      General: She is not in acute distress.    Appearance: She is  well-developed. She is not diaphoretic.  HENT:     Head: Normocephalic and atraumatic.  Eyes:     Pupils: Pupils are equal, round, and reactive to light.  Neck:     Musculoskeletal: Normal range of motion and neck supple.  Cardiovascular:     Rate and Rhythm: Normal rate and regular rhythm.     Heart sounds: No murmur. No friction rub. No gallop.   Pulmonary:     Effort: Pulmonary effort is normal.     Breath sounds: No wheezing or rales.  Abdominal:     General: There is no distension.     Palpations: Abdomen is soft.     Tenderness: There is no abdominal tenderness.  Musculoskeletal:        General: No tenderness.  Skin:    General: Skin is warm and dry.  Neurological:     Mental Status: She is alert and oriented to person, place, and time.  Psychiatric:        Behavior: Behavior normal.      ED Treatments / Results  Labs (all labs ordered are listed, but only abnormal results are displayed) Labs Reviewed  CBC WITH DIFFERENTIAL/PLATELET - Abnormal; Notable for the following components:      Result Value   Hemoglobin 11.3 (*)    MCH 25.6 (*)    RDW 16.0 (*)    All other components within normal limits  BASIC METABOLIC PANEL - Abnormal; Notable for the following components:   Sodium 134 (*)    BUN 21 (*)    All other components within normal limits    EKG EKG Interpretation  Date/Time:  Tuesday April 13 2019 16:41:17 EDT Ventricular Rate:  81 PR Interval:    QRS Duration: 91 QT Interval:  378 QTC Calculation: 439 R Axis:   81 Text Interpretation:  Sinus rhythm No old tracing to compare Confirmed by Deno Etienne [id number]) on 04/13/2019 4:46:57 PM   Radiology No results found.  Procedures Procedures (including critical care time)  Medications Ordered in ED Medications - No data to display   Initial Impression / Assessment and Plan / ED Course  I have reviewed the triage vital signs and the nursing notes.  Pertinent labs & imaging results that were  available during my care of the patient were reviewed by me and considered in my medical decision making (see chart for details).        23 yo F with a chief complaint of palpitations.  The patient is well-appearing and nontoxic.  I discussed with her the most common causes of sinus tachycardia which is the rhythm that she appears to be in.  She denies any illegal drug or stimulant use.  Took a pregnancy test just prior to coming which was negative.  Declining repeat  test here.  She would like basic lab work performed.  She had thyroid studies done a couple days ago that were indicative of hypothyroidism per her.  I do not have results in front of me.  Patient is not in thyroid storm clinically she is not confused she is not febrile not tachycardic.  I will obtain basic lab work here.  Have her follow-up with her family doctor.  labwork unremarkable.  D/c home.   6:06 PM:  I have discussed the diagnosis/risks/treatment options with the patient and believe the pt to be eligible for discharge home to follow-up with PCP. We also discussed returning to the ED immediately if new or worsening sx occur. We discussed the sx which are most concerning (e.g., sudden worsening pain, fever, inability to tolerate by mouth) that necessitate immediate return. Medications administered to the patient during their visit and any new prescriptions provided to the patient are listed below.  Medications given during this visit Medications - No data to display   The patient appears reasonably screen and/or stabilized for discharge and I doubt any other medical condition or other [facility] requiring further screening, evaluation, or treatment in the ED at this time prior to discharge.    Final Clinical Impressions(s) / ED Diagnoses   Final diagnoses:  Palpitations    ED Discharge Orders    None       Deno Etienne, DO 04/13/19 1806

## 2019-04-14 ENCOUNTER — Ambulatory Visit (INDEPENDENT_AMBULATORY_CARE_PROVIDER_SITE_OTHER): Payer: BLUE CROSS/BLUE SHIELD | Admitting: Family Medicine

## 2019-04-14 ENCOUNTER — Encounter: Payer: Self-pay | Admitting: Family Medicine

## 2019-04-14 DIAGNOSIS — R002 Palpitations: Secondary | ICD-10-CM | POA: Diagnosis not present

## 2019-04-14 DIAGNOSIS — J392 Other diseases of pharynx: Secondary | ICD-10-CM | POA: Diagnosis not present

## 2019-04-14 MED ORDER — FEXOFENADINE HCL 180 MG PO TABS: 180.0000 mg | ORAL_TABLET | Freq: Every day | ORAL | 5 refills | Status: DC

## 2019-04-14 NOTE — Patient Instructions (Signed)
Claritin (loratadine), Allegra (fexofenadine), Zyrtec (cetirizine) which is also equivalent to Xyzal (levocetirizine); these are listed in order from weakest to strongest. Generic, and therefore cheaper, options are in the parentheses.   Flonase (fluticasone); nasal spray that is over the counter. 2 sprays each nostril, once daily. Aim towards the same side eye when you spray.  There are available OTC, and the generic versions, which may be cheaper, are in parentheses. Show this to a pharmacist if you have trouble finding any of these items.  If this does not help, I would consider treating for reflux for the throat "rawness" feeling.   Let us know if you need anything.

## 2019-04-14 NOTE — Progress Notes (Signed)
Chief Complaint  Patient presents with  . Follow-up    Subjective: Patient is a 23 y.o. female here for ED f/u for palpitations. Due to COVID-19 pandemic, we are interacting via web portal for an electronic face-to-face visit. I verified patient's ID using 2 identifiers. Patient agreed to proceed with visit via this method. Patient is at home, I am at office. Patient and I are present for visit.   2 weeks has been having intermittent palpitations. Went to ED and had neg workup. Did not have s/s's during EKG. Lasts 5-10 min, 5-10x/d. No trigger, +associated sob. +cp that started 4 d ago. No hx of this, no caffeine/alcohol intake, no illicit drugs, no famhx. Thyroid checked and T4 was WNL.   Dry throat for 2 weeks that feels "raw". Has not tried anything for this.   ROS: Heart: + palpitations Lungs: Denies current SOB   Past Medical History:  Diagnosis Date  . Hypertension   . Thyroid disease     Objective: LMP 03/24/2019  No conversational dyspnea Age appropriate judgment and insight Nml affect and mood   Assessment and Plan: Palpitations - Plan: Holter monitor - 24 hour  Dry throat - Plan: fexofenadine (ALLEGRA) 180 MG tablet  Ck Holter monitor. 24 hrs should be sufficient given the freq of her s/s's. No extra labs worth checking at this time. Trial PO antihistamine. If no help, could consider PPI.  F/u pending above.  The patient voiced understanding and agreement to the plan.  [place], DO 04/14/19  4:14 PM

## 2019-07-08 DIAGNOSIS — E890 Postprocedural hypothyroidism: Secondary | ICD-10-CM | POA: Diagnosis not present

## 2019-12-03 ENCOUNTER — Encounter: Payer: Self-pay | Admitting: Family Medicine

## 2019-12-03 ENCOUNTER — Other Ambulatory Visit: Payer: Self-pay

## 2019-12-03 ENCOUNTER — Ambulatory Visit: Payer: 59 | Admitting: Family Medicine

## 2019-12-03 VITALS — BP 108/72 | HR 69 | Temp 97.4°F | Ht 62.0 in | Wt 216.0 lb

## 2019-12-03 DIAGNOSIS — M6752 Plica syndrome, left knee: Secondary | ICD-10-CM | POA: Diagnosis not present

## 2019-12-03 MED ORDER — MELOXICAM 15 MG PO TABS: 15.0000 mg | ORAL_TABLET | Freq: Every day | ORAL | 0 refills | Status: DC

## 2019-12-03 NOTE — Progress Notes (Signed)
Musculoskeletal Exam  Patient: Amanda Benitez DOB: 04-03-1996  DOS: 12/03/2019  SUBJECTIVE:  Chief Complaint:   Chief Complaint  Patient presents with  . Knee Injury    Injured one week ago    Amanda Benitez is a 23 y.o.  female for evaluation and treatment of L knee pain.   Onset:  2 weeks ago. Fell on her L knee to a wooden/pointy portion of bed frame Location:  Character:  burning and sharp  Progression of issue:  has worsened Associated symptoms: bruising and swelling initially that improved; nml ROM Treatment: to date has been rest, ice, OTC NSAIDS and acetaminophen.   Neurovascular symptoms: no  ROS: Musculoskeletal/Extremities: +L knee pain  Past Medical History:  Diagnosis Date  . Hypertension   . Thyroid disease     Objective: VITAL SIGNS: BP 108/72 (BP Location: Left Arm, Patient Position: Sitting, Cuff Size: Normal)   Pulse 69   Temp (!) 97.4 F (36.3 C) (Temporal)   Ht 5\' 2"  (1.575 m)   Wt 216 lb (98 kg)   SpO2 98%   BMI 39.51 kg/m  Constitutional: Well formed, well developed. No acute distress. Cardiovascular: Brisk cap refill Thorax & Lungs: No accessory muscle use Musculoskeletal: L knee.   Normal active range of motion: yes.   Normal passive range of motion: yes Tenderness to palpation: yes; over plica of medial and lat fem condyles Deformity: no Ecchymosis: no Tests positive: none Tests negative: Lachman's, varus/valgus, pat app/grind, Stine's Neurologic: Normal sensory function. No focal deficits noted. Gait nml.  Psychiatric: Normal mood. Age appropriate judgment and insight. Alert & oriented x 3.    Assessment:  Plica syndrome of knee, left - Plan: meloxicam (MOBIC) 15 MG tablet  Plan: Pt did feel some instability w her patellar upon manipulation. I will give her some knee stretches/exercises to strengthen her quad. Ice, Mobic, Tylenol for her plica. Consider injections vs nitro patches if no improvement.  F/u prn. The patient  voiced understanding and agreement to the plan.   [place], DO 12/04/19  7:50 AM

## 2019-12-03 NOTE — Patient Instructions (Signed)
Ice/cold pack over area for 10-15 min twice daily.  OK to take Tylenol 1000 mg (2 extra strength tabs) or 975 mg (3 regular strength tabs) every 6 hours as needed.  Knee Exercises It is normal to feel mild stretching, pulling, tightness, or discomfort as you do these exercises, but you should stop right away if you feel sudden pain or your pain gets worse. STRETCHING AND RANGE OF MOTION EXERCISES  These exercises warm up your muscles and joints and improve the movement and flexibility of your knee. These exercises also help to relieve pain, numbness, and tingling. Exercise A: Knee Extension, Prone  1. Lie on your abdomen on a bed. 2. Place your left / right knee just beyond the edge of the surface so your knee is not on the bed. You can put a towel under your left / right thigh just above your knee for comfort. 3. Relax your leg muscles and allow gravity to straighten your knee. You should feel a stretch behind your left / right knee. 4. Hold this position for 30 seconds. 5. Scoot up so your knee is supported between repetitions. Repeat 2 times. Complete this stretch 3 times per week. Exercise B: Knee Flexion, Active    1. Lie on your back with both knees straight. If this causes back discomfort, bend your left / right knee so your foot is flat on the floor. 2. Slowly slide your left / right heel back toward your buttocks until you feel a gentle stretch in the front of your knee or thigh. 3. Hold this position for 30 seconds. 4. Slowly slide your left / right heel back to the starting position. Repeat 2 times. Complete this exercise 3 times per week. Exercise C: Quadriceps, Prone    1. Lie on your abdomen on a firm surface, such as a bed or padded floor. 2. Bend your left / right knee and hold your ankle. If you cannot reach your ankle or pant leg, loop a belt around your foot and grab the belt instead. 3. Gently pull your heel toward your buttocks. Your knee should not slide out to  the side. You should feel a stretch in the front of your thigh and knee. 4. Hold this position for 30 seconds. Repeat 2 times. Complete this stretch 3 times per week. Exercise D: Hamstring, Supine  1. Lie on your back. 2. Loop a belt or towel over the ball of your left / right foot. The ball of your foot is on the walking surface, right under your toes. 3. Straighten your left / right knee and slowly pull on the belt to raise your leg until you feel a gentle stretch behind your knee. ? Do not let your left / right knee bend while you do this. ? Keep your other leg flat on the floor. 4. Hold this position for 30 seconds. Repeat 2 times. Complete this stretch 3 times per week. STRENGTHENING EXERCISES  These exercises build strength and endurance in your knee. Endurance is the ability to use your muscles for a long time, even after they get tired. Exercise E: Quadriceps, Isometric    1. Lie on your back with your left / right leg extended and your other knee bent. Put a rolled towel or small pillow under your knee if told by your health care provider. 2. Slowly tense the muscles in the front of your left / right thigh. You should see your kneecap slide up toward your hip or see increased  dimpling just above the knee. This motion will push the back of the knee toward the floor. 3. For 3 seconds, keep the muscle as tight as you can without increasing your pain. 4. Relax the muscles slowly and completely. Repeat for 10 total reps Repeat 2 ti mes. Complete this exercise 3 times per week. Exercise F: Straight Leg Raises - Quadriceps  1. Lie on your back with your left / right leg extended and your other knee bent. 2. Tense the muscles in the front of your left / right thigh. You should see your kneecap slide up or see increased dimpling just above the knee. Your thigh may even shake a bit. 3. Keep these muscles tight as you raise your leg 4-6 inches (10-15 cm) off the floor. Do not let your knee  bend. 4. Hold this position for 3 seconds. 5. Keep these muscles tense as you lower your leg. 6. Relax your muscles slowly and completely after each repetition. 10 total reps. Repeat 2 times. Complete this exercise 3 times per week.  Exercise G: Hamstring Curls    If told by your health care provider, do this exercise while wearing ankle weights. Begin with 5 lb weights (optional). Then increase the weight by 1 lb (0.5 kg) increments. Do not wear ankle weights that are more than 20 lbs to start with. 1. Lie on your abdomen with your legs straight. 2. Bend your left / right knee as far as you can without feeling pain. Keep your hips flat against the floor. 3. Hold this position for 3 seconds. 4. Slowly lower your leg to the starting position. Repeat for 10 reps.  Repeat 2 times. Complete this exercise 3 times per week. Exercise H: Squats (Quadriceps)  1. Stand in front of a table, with your feet and knees pointing straight ahead. You may rest your hands on the table for balance but not for support. 2. Slowly bend your knees and lower your hips like you are going to sit in a chair. ? Keep your weight over your heels, not over your toes. ? Keep your lower legs upright so they are parallel with the table legs. ? Do not let your hips go lower than your knees. ? Do not bend lower than told by your health care provider. ? If your knee pain increases, do not bend as low. 3. Hold the squat position for 1 second. 4. Slowly push with your legs to return to standing. Do not use your hands to pull yourself to standing. Repeat 2 times. Complete this exercise 3 times per week. Exercise I: Wall Slides (Quadriceps)    1. Lean your back against a smooth wall or door while you walk your feet out 18-24 inches (46-61 cm) from it. 2. Place your feet hip-width apart. 3. Slowly slide down the wall or door until your knees Repeat 2 times. Complete this exercise every other day. 4. Exercise K: Straight Leg  Raises - Hip Abductors  1. Lie on your side with your left / right leg in the top position. Lie so your head, shoulder, knee, and hip line up. You may bend your bottom knee to help you keep your balance. 2. Roll your hips slightly forward so your hips are stacked directly over each other and your left / right knee is facing forward. 3. Leading with your heel, lift your top leg 4-6 inches (10-15 cm). You should feel the muscles in your outer hip lifting. ? Do not let your foot  drift forward. ? Do not let your knee roll toward the ceiling. 4. Hold this position for 3 seconds. 5. Slowly return your leg to the starting position. 6. Let your muscles relax completely after each repetition. 10 total reps. Repeat 2 times. Complete this exercise 3 times per week. Exercise J: Straight Leg Raises - Hip Extensors  1. Lie on your abdomen on a firm surface. You can put a pillow under your hips if that is more comfortable. 2. Tense the muscles in your buttocks and lift your left / right leg about 4-6 inches (10-15 cm). Keep your knee straight as you lift your leg. 3. Hold this position for 3 seconds. 4. Slowly lower your leg to the starting position. 5. Let your leg relax completely after each repetition. Repeat 2 times. Complete this exercise 3 times per week. Document Released: 10/16/2005 Document Revised: 08/26/2016 Document Reviewed: 10/08/2015 Elsevier Interactive Patient Education  2017 [employer].

## 2019-12-04 ENCOUNTER — Encounter: Payer: Self-pay | Admitting: Family Medicine

## 2020-01-04 ENCOUNTER — Other Ambulatory Visit: Payer: Self-pay | Admitting: Family Medicine

## 2020-01-04 DIAGNOSIS — M6752 Plica syndrome, left knee: Secondary | ICD-10-CM

## 2020-02-16 IMAGING — RF DG HYSTEROGRAM
4 series · 4 of 4 positions shown · IV contrast (omnipaque)
Comparison: None.

CLINICAL DATA: Infertility evaluation

EXAM:
HYSTEROSALPINGOGRAM
TECHNIQUE: Following cleansing of the cervix and vagina with Betadine solution,
a hysterosalpingogram was performed using a 5-French
hysterosalpingogram catheter and Omnipaque 300 contrast. The patient
tolerated the examination without difficulty.

[Series 1: run · 1 of 1 slices shown (1 of 4)]
[im 1/1]
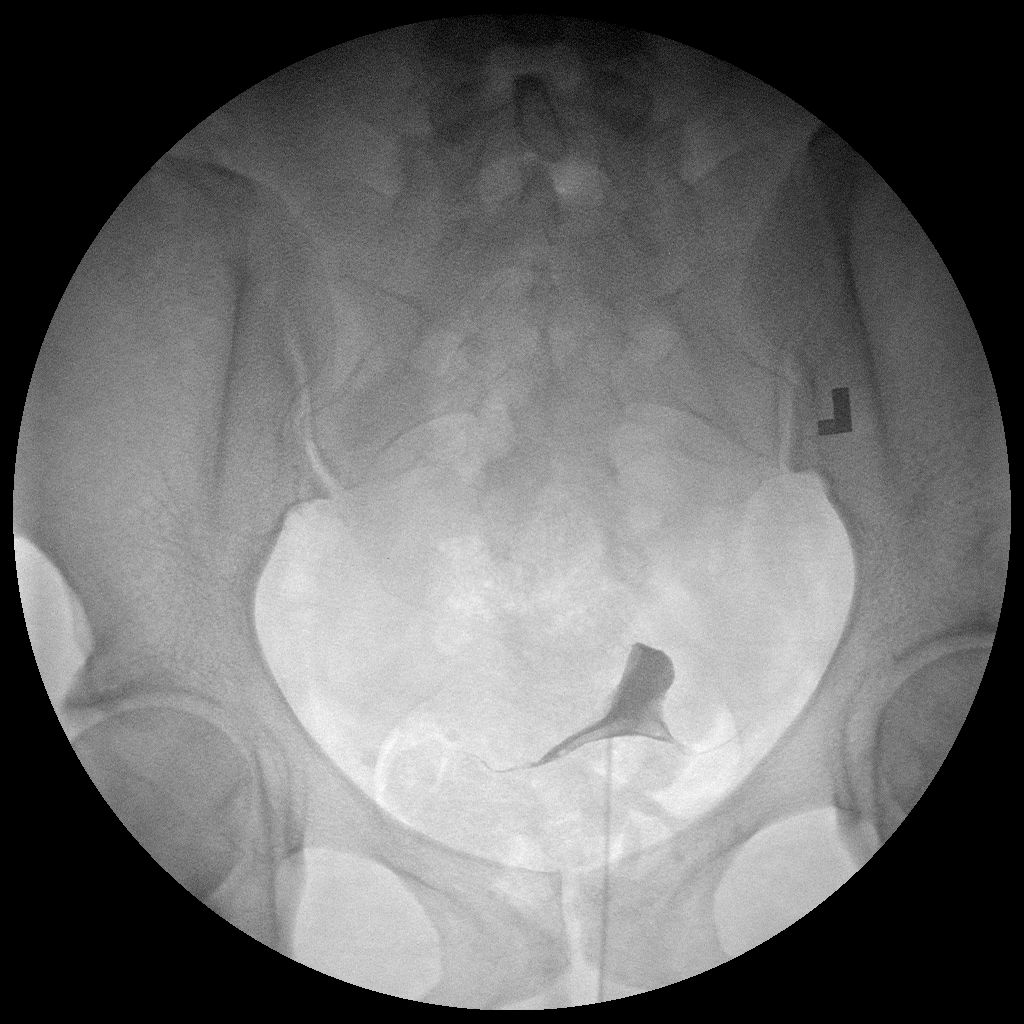

[Series 2: run · 1 of 1 slices shown (2 of 4)]
[im 1/1]
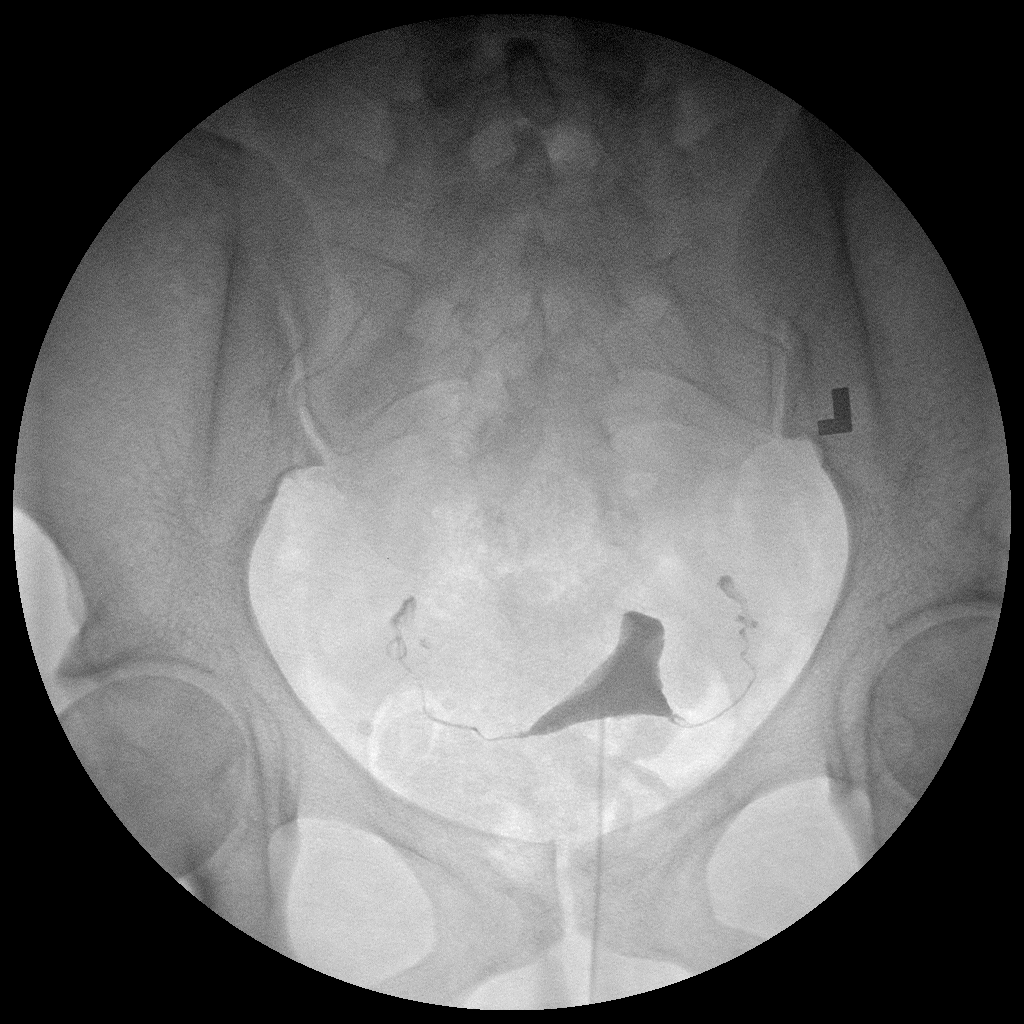

[Series 3: run · 1 of 1 slices shown (3 of 4)]
[im 1/1]
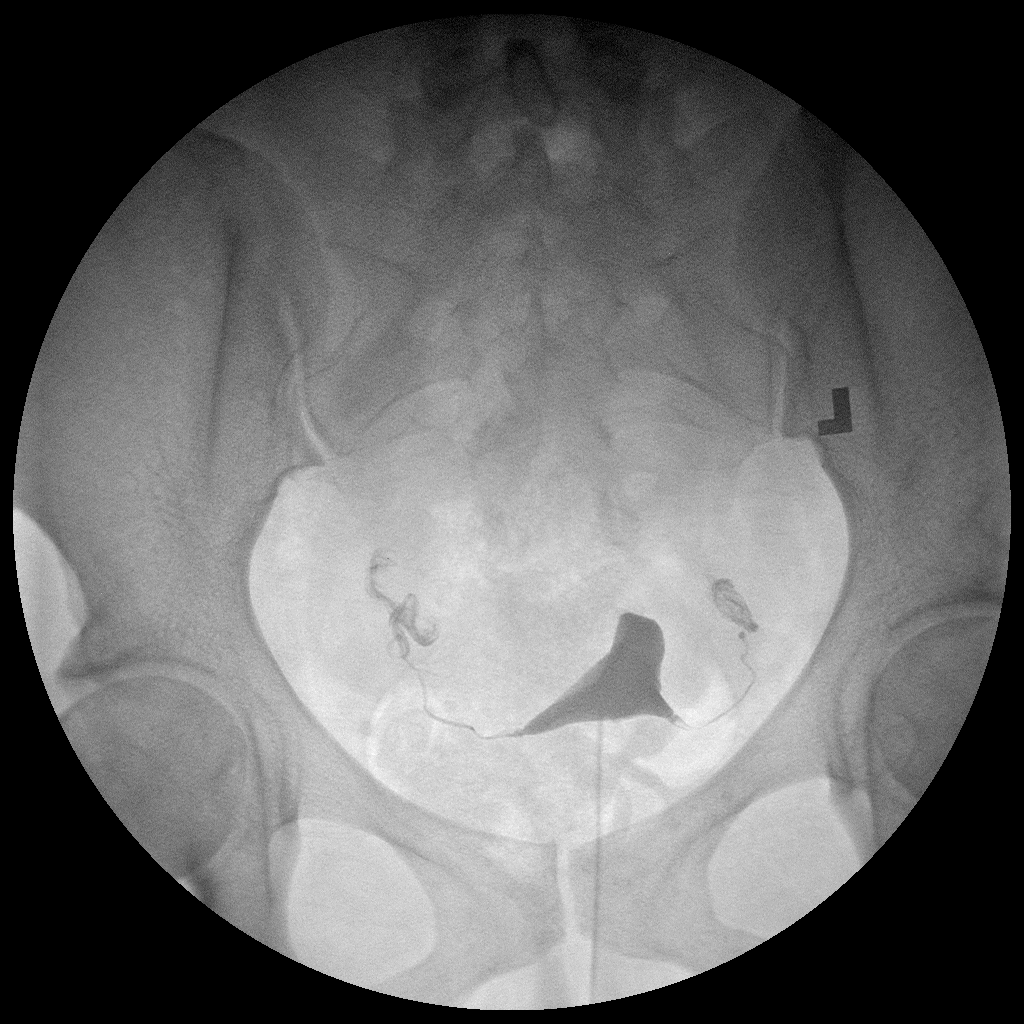

[Series 4: run · 1 of 1 slices shown (4 of 4)]
[im 1/1]
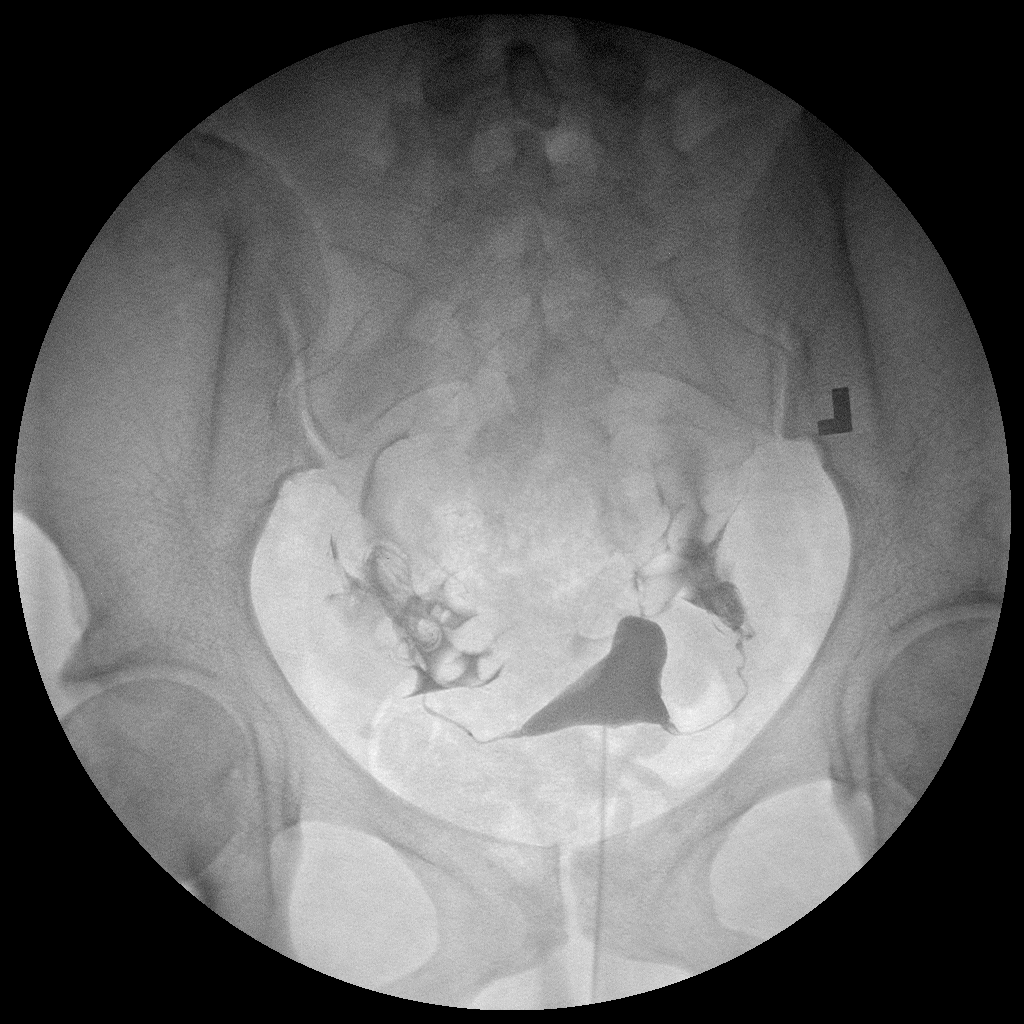

[4 of 4 positions shown; findings below may reference images not displayed]

FLUOROSCOPY TIME:  Radiation Exposure Index (as provided by the
fluoroscopic device):

If the device does not provide the exposure index:

Fluoroscopy Time:  36 seconds

Number of Acquired Images:  4
FINDINGS: Endometrial cavity is normal. Both fallopian tubes fill and have a
normal appearance. Normal spillage bilaterally.
IMPRESSION: Normal study.

## 2020-02-18 ENCOUNTER — Emergency Department (HOSPITAL_BASED_OUTPATIENT_CLINIC_OR_DEPARTMENT_OTHER)
Admission: EM | Admit: 2020-02-18 | Discharge: 2020-02-19 | Disposition: A | Discharge status: Home / Self Care | Payer: 59 | Attending: Emergency Medicine | Admitting: Emergency Medicine

## 2020-02-18 ENCOUNTER — Other Ambulatory Visit: Payer: Self-pay

## 2020-02-18 ENCOUNTER — Emergency Department (HOSPITAL_BASED_OUTPATIENT_CLINIC_OR_DEPARTMENT_OTHER): Payer: 59

## 2020-02-18 ENCOUNTER — Encounter (HOSPITAL_BASED_OUTPATIENT_CLINIC_OR_DEPARTMENT_OTHER): Payer: Self-pay | Admitting: *Deleted

## 2020-02-18 DIAGNOSIS — I10 Essential (primary) hypertension: Secondary | ICD-10-CM | POA: Diagnosis not present

## 2020-02-18 DIAGNOSIS — Z91040 Latex allergy status: Secondary | ICD-10-CM | POA: Diagnosis not present

## 2020-02-18 DIAGNOSIS — Z79899 Other long term (current) drug therapy: Secondary | ICD-10-CM | POA: Diagnosis not present

## 2020-02-18 DIAGNOSIS — R1032 Left lower quadrant pain: Secondary | ICD-10-CM | POA: Diagnosis present

## 2020-02-18 LAB — COMPREHENSIVE METABOLIC PANEL
ALT: 11 U/L (ref 0–44)
AST: 12 U/L — ABNORMAL LOW (ref 15–41)
Albumin: 3.7 g/dL (ref 3.5–5.0)
Alkaline Phosphatase: 49 U/L (ref 38–126)
Anion gap: 9 (ref 5–15)
BUN: 15 mg/dL (ref 6–20)
CO2: 24 mmol/L (ref 22–32)
Calcium: 8.7 mg/dL — ABNORMAL LOW (ref 8.9–10.3)
Chloride: 105 mmol/L (ref 98–111)
Creatinine, Ser: 0.84 mg/dL (ref 0.44–1.00)
GFR calc Af Amer: >60 mL/min (ref >60)
GFR calc non Af Amer: >60 mL/min (ref >60)
Glucose, Bld: 95 mg/dL (ref 70–99)
Potassium: 3.4 mmol/L — ABNORMAL LOW (ref 3.5–5.1)
Sodium: 138 mmol/L (ref 135–145)
Total Bilirubin: 0.4 mg/dL (ref 0.3–1.2)
Total Protein: 7.4 g/dL (ref 6.5–8.1)

## 2020-02-18 LAB — CBC WITH DIFFERENTIAL/PLATELET
Abs Immature Granulocytes: 0.01 10*3/uL (ref 0.00–0.07)
Basophils Absolute: 0 10*3/uL (ref 0.0–0.1)
Basophils Relative: 1 %
Eosinophils Absolute: 0 10*3/uL (ref 0.0–0.5)
Eosinophils Relative: 0 %
HCT: 33.9 % — ABNORMAL LOW (ref 36.0–46.0)
Hemoglobin: 10.9 g/dL — ABNORMAL LOW (ref 12.0–15.0)
Immature Granulocytes: 0 %
Lymphocytes Relative: 55 %
Lymphs Abs: 4.4 10*3/uL — ABNORMAL HIGH (ref 0.7–4.0)
MCH: 26.7 pg (ref 26.0–34.0)
MCHC: 32.2 g/dL (ref 30.0–36.0)
MCV: 83.1 fL (ref 80.0–100.0)
Monocytes Absolute: 0.5 10*3/uL (ref 0.1–1.0)
Monocytes Relative: 6 %
Neutro Abs: 3.1 10*3/uL (ref 1.7–7.7)
Neutrophils Relative %: 38 %
Platelets: 330 10*3/uL (ref 150–400)
RBC: 4.08 MIL/uL (ref 3.87–5.11)
RDW: 16 % — ABNORMAL HIGH (ref 11.5–15.5)
WBC: 8.1 10*3/uL (ref 4.0–10.5)
nRBC: 0 % (ref 0.0–0.2)

## 2020-02-18 LAB — LIPASE, BLOOD: Lipase: 27 U/L (ref 11–51)

## 2020-02-18 MED ORDER — SODIUM CHLORIDE 0.9 % IV BOLUS
1000.0000 mL | Freq: Once | INTRAVENOUS | Status: AC
  Administered 2020-02-18: 1000 mL via INTRAVENOUS

## 2020-02-18 NOTE — ED Notes (Signed)
Pt. Reports she has not had a BM for 4 days until today.  Pt. Reports the BM today was firm.  Pt. Reports no diarrhea and has had nausea today.

## 2020-02-18 NOTE — ED Triage Notes (Signed)
Abdominal pain x 4 days. Sharp in nature. Blood in her stool. Hard stool.

## 2020-02-18 NOTE — ED Notes (Signed)
Pt unable to urinate at this time.  

## 2020-02-18 NOTE — ED Provider Notes (Signed)
[facility] [place] [facility] EMERGENCY DEPARTMENT Provider Note   CSN: [id number] Arrival date & time: 02/18/20  2222     History Chief Complaint  Patient presents with  . Abdominal Pain    Amanda Benitez is a 24 y.o. female.  Patient presents to the emergency department for evaluation of abdominal pain.  Patient reports that symptoms been ongoing for 4 days.  She has been having diffuse abdominal cramping over this period of time.  No nausea or vomiting, no fever.  She has felt constipated.  Today she had a bowel movement that was painful and there was some blood in the stool.        Past Medical History:  Diagnosis Date  . Hypertension   . Thyroid disease     Patient Active Problem List   Diagnosis Date Noted  . 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 THYROIDECTOMY  01/16/2017     OB History   No obstetric history on file.     Family History  Problem Relation Age of Onset  . Hypertension Mother   . Glaucoma Mother   . Asthma Mother   . Hypertension Father   . Hyperlipidemia Father   . Hyperlipidemia Sister     Social History   Tobacco Use  . Smoking status: Never Smoker  . Smokeless tobacco: Never Used  Substance Use Topics  . Alcohol use: Yes    Comment: rarely  . Drug use: No    Home Medications Prior to Admission medications   Medication Sig Start Date End Date Taking? Authorizing Provider  levothyroxine (SYNTHROID) 200 MCG tablet Take 200 mcg by mouth daily before breakfast.   Yes [provider]  docusate sodium (COLACE) 100 MG capsule Take 1 capsule (100 mg total) by mouth every 12 (twelve) hours. 02/19/20   Orpah Greek, MD  ibuprofen (ADVIL) 800 MG tablet Take 800 mg by mouth as needed for cramping.    [provider]  meloxicam (MOBIC) 15 MG tablet TAKE 1 TABLET BY MOUTH EVERY DAY 01/04/20   Shelda Pal, DO    Allergies    Doxycycline, Adhesive  [tape], Latex, and Minocycline  Review of Systems   Review of Systems  Gastrointestinal: Positive for abdominal pain, blood in stool and constipation.  All other systems reviewed and are negative.   Physical Exam Updated Vital Signs BP (!) 134/101   Pulse 84   Temp 98.3 F (36.8 C) (Oral)   Resp 20   Ht 5\' 2"  (1.575 m)   Wt 91.6 kg   LMP 02/10/2020   SpO2 100%   BMI 36.95 kg/m   Physical Exam Vitals and nursing note reviewed.  Constitutional:      General: She is not in acute distress.    Appearance: Normal appearance. She is well-developed.  HENT:     Head: Normocephalic and atraumatic.     Right Ear: Hearing normal.     Left Ear: Hearing normal.     Nose: Nose normal.  Eyes:     Conjunctiva/sclera: Conjunctivae normal.     Pupils: Pupils are equal, round, and reactive to light.  Cardiovascular:     Rate and Rhythm: Regular rhythm.     Heart sounds: S1 normal and S2 normal. No murmur. No friction rub. No gallop.   Pulmonary:     Effort: Pulmonary effort is normal. No respiratory distress.     Breath sounds: Normal breath sounds.  Chest:  Chest wall: No tenderness.  Abdominal:     General: Bowel sounds are normal.     Palpations: Abdomen is soft.     Tenderness: There is generalized abdominal tenderness. There is no guarding or rebound. Negative signs include Murphy's sign and McBurney's sign.     Hernia: No hernia is present.  Musculoskeletal:        General: Normal range of motion.     Cervical back: Normal range of motion and neck supple.  Skin:    General: Skin is warm and dry.     Findings: No rash.  Neurological:     Mental Status: She is alert and oriented to person, place, and time.     GCS: GCS eye subscore is 4. GCS verbal subscore is 5. GCS motor subscore is 6.     Cranial Nerves: No cranial nerve deficit.     Sensory: No sensory deficit.     Coordination: Coordination normal.  Psychiatric:        Speech: Speech normal.        Behavior:  Behavior normal.        Thought Content: Thought content normal.     ED Results / Procedures / Treatments   Labs (all labs ordered are listed, but only abnormal results are displayed) Labs Reviewed  CBC WITH DIFFERENTIAL/PLATELET - Abnormal; Notable for the following components:      Result Value   Hemoglobin 10.9 (*)    HCT 33.9 (*)    RDW 16.0 (*)    Lymphs Abs 4.4 (*)    All other components within normal limits  COMPREHENSIVE METABOLIC PANEL - Abnormal; Notable for the following components:   Potassium 3.4 (*)    Calcium 8.7 (*)    AST 12 (*)    All other components within normal limits  LIPASE, BLOOD  URINALYSIS, ROUTINE W REFLEX MICROSCOPIC  PREGNANCY, URINE    EKG None  Radiology CT ABDOMEN PELVIS W CONTRAST  Result Date: 02/19/2020 CLINICAL DATA:  Abdominal pain. EXAM: CT ABDOMEN AND PELVIS WITH CONTRAST TECHNIQUE: Multidetector CT imaging of the abdomen and pelvis was performed using the standard protocol following bolus administration of intravenous contrast. CONTRAST:  110mL OMNIPAQUE IOHEXOL 300 MG/ML  SOLN COMPARISON:  None. FINDINGS: Lower chest: The lung bases are clear. The heart size is normal. Hepatobiliary: The liver is normal. Normal gallbladder.There is no biliary ductal dilation. Pancreas: Normal contours without ductal dilatation. No peripancreatic fluid collection. Spleen: No splenic laceration or hematoma. Adrenals/Urinary Tract: --Adrenal glands: No adrenal hemorrhage. --Right kidney/ureter: No hydronephrosis or perinephric hematoma. --Left kidney/ureter: No hydronephrosis or perinephric hematoma. --Urinary bladder: Unremarkable. Stomach/Bowel: --Stomach/Duodenum: No hiatal hernia or other gastric abnormality. Normal duodenal course and caliber. --Small bowel: No dilatation or inflammation. --Colon: The stool burden is average. There is no evidence for colitis. --Appendix: Normal. Vascular/Lymphatic: Normal course and caliber of the major abdominal vessels.  --No retroperitoneal lymphadenopathy. --No mesenteric lymphadenopathy. --No pelvic or inguinal lymphadenopathy. Reproductive: Unremarkable Other: No ascites or free air. The abdominal wall is normal. Musculoskeletal. No acute displaced fractures. IMPRESSION: 1. The stool burden is average. 2. No evidence of colitis or bowel obstruction. 3. Normal appendix. Electronically Signed   By: Constance Holster M.D.   On: 02/19/2020 01:14    Procedures Procedures (including critical care time)  Medications Ordered in ED Medications  sodium chloride 0.9 % bolus 1,000 mL (0 mLs Intravenous Stopped 02/19/20 0048)  iohexol (OMNIPAQUE) 300 MG/ML solution 100 mL (100 mLs Intravenous Contrast Given 02/19/20  0049)    ED Course  I have reviewed the triage vital signs and the nursing notes.  Pertinent labs & imaging results that were available during my care of the patient were reviewed by me and considered in my medical decision making (see chart for details).    MDM Rules/Calculators/A&P                      Patient presents to the emergency department for evaluation of abdominal pain.  Symptoms are present for 4 days.  She has felt constipated, did have a hard stool that had blood in it today.  Examination reveals a soft abdomen with minimal tenderness.  She did have some very slight tenderness in the left lower quadrant.  Based on her symptoms, colitis/proctitis was considered possible.  Her lab work was normal.  CT scan does not show any acute inflammatory process.  Treat with stool softeners, follow-up as needed.  Final Clinical Impression(s) / ED Diagnoses Final diagnoses:  Left lower quadrant abdominal pain    Rx / DC Orders ED Discharge Orders         Ordered    docusate sodium (COLACE) 100 MG capsule  Every 12 hours     02/19/20 0139           Orpah Greek, MD 02/19/20 0139

## 2020-02-19 LAB — URINALYSIS, ROUTINE W REFLEX MICROSCOPIC
Bilirubin Urine: NEGATIVE
Glucose, UA: NEGATIVE mg/dL
Hgb urine dipstick: NEGATIVE
Ketones, ur: NEGATIVE mg/dL
Leukocytes,Ua: NEGATIVE
Nitrite: NEGATIVE
Protein, ur: NEGATIVE mg/dL
Specific Gravity, Urine: 1.025 (ref 1.005–1.030)
pH: 6 (ref 5.0–8.0)

## 2020-02-19 LAB — PREGNANCY, URINE: Preg Test, Ur: NEGATIVE

## 2020-02-19 MED ORDER — IOHEXOL 300 MG/ML  SOLN
100.0000 mL | Freq: Once | INTRAMUSCULAR | Status: AC | PRN
  Administered 2020-02-19: 100 mL via INTRAVENOUS

## 2020-02-19 MED ORDER — HYDROCORTISONE ACETATE 25 MG RE SUPP: 25.0000 mg | Freq: Two times a day (BID) | RECTAL | 0 refills | Status: DC

## 2020-02-19 MED ORDER — HYDROCORTISONE ACETATE 25 MG RE SUPP: 25.0000 mg | Freq: Two times a day (BID) | RECTAL | 0 refills | Status: AC

## 2020-02-19 MED ORDER — DOCUSATE SODIUM 100 MG PO CAPS: 100.0000 mg | ORAL_CAPSULE | Freq: Two times a day (BID) | ORAL | 0 refills | Status: AC

## 2020-02-19 NOTE — ED Notes (Signed)
Urine dropped off at lab

## 2020-04-17 DIAGNOSIS — E559 Vitamin D deficiency, unspecified: Secondary | ICD-10-CM | POA: Insufficient documentation

## 2020-07-07 ENCOUNTER — Other Ambulatory Visit: Payer: Self-pay | Admitting: Family Medicine

## 2020-07-07 DIAGNOSIS — J392 Other diseases of pharynx: Secondary | ICD-10-CM

## 2021-03-22 ENCOUNTER — Other Ambulatory Visit: Payer: Self-pay

## 2021-03-22 ENCOUNTER — Emergency Department (INDEPENDENT_AMBULATORY_CARE_PROVIDER_SITE_OTHER)
Admission: EM | Admit: 2021-03-22 | Discharge: 2021-03-22 | Disposition: A | Discharge status: Home / Self Care | Payer: 59 | Source: Home / Self Care | Attending: Emergency Medicine | Admitting: Emergency Medicine

## 2021-03-22 ENCOUNTER — Encounter: Payer: Self-pay | Admitting: Emergency Medicine

## 2021-03-22 DIAGNOSIS — M654 Radial styloid tenosynovitis [de Quervain]: Secondary | ICD-10-CM | POA: Diagnosis not present

## 2021-03-22 MED ORDER — IBUPROFEN 600 MG PO TABS: 600.0000 mg | ORAL_TABLET | Freq: Four times a day (QID) | ORAL | 0 refills | Status: AC | PRN

## 2021-03-22 MED ORDER — METHYLPREDNISOLONE 4 MG PO TBPK: ORAL_TABLET | Freq: Every day | ORAL | 0 refills | Status: AC

## 2021-03-22 NOTE — ED Provider Notes (Signed)
HPI  SUBJECTIVE:  Amanda Benitez is a right-handed 25 y.o. female who presents with intermittent daily medial left wrist/thumb pain described as achiness for the past 2 months.  She notes swelling over the metacarpal joint and reports bruising yesterday.  She denies doing repetitive motion, no trauma, no erythema, fevers, numbness or tingling, grip weakness.  She has tried 800 mg of ibuprofen every 8 hours without improvement in her symptoms.  Symptoms are worse with movement, palpation.  She has a past medical history of Graves' disease, hypertension, hypothyroidism.  No history of diabetes, de Quervain's tenosynovitis, osteoarthritis, rheumatoid arthritis.  LMP: 3/14.  Denies possibility being pregnant.  PMD: She is establishing care with a PMD on 4/22 at [facility].    Past Medical History:  Diagnosis Date  . Hypertension   . Thyroid disease     Past Surgical History:  Procedure Laterality Date  . THYROIDECTOMY  01/16/2017    Family History  Problem Relation Age of Onset  . Hypertension Mother   . Glaucoma Mother   . Asthma Mother   . Hypertension Father   . Hyperlipidemia Father   . Hyperlipidemia Sister     Social History   Tobacco Use  . Smoking status: Never Smoker  . Smokeless tobacco: Never Used  Substance Use Topics  . Alcohol use: Yes    Comment: rarely  . Drug use: No    No current facility-administered medications for this encounter.  Current Outpatient Medications:  .  Cholecalciferol 25 MCG (1000 UT) capsule, 5,000 Units., Disp: , Rfl:  .  cyanocobalamin 1000 MCG tablet, Take 1,000 mcg by mouth daily., Disp: , Rfl:  .  docusate sodium (COLACE) 100 MG capsule, Take 1 capsule (100 mg total) by mouth every 12 (twelve) hours., Disp: 60 capsule, Rfl: 0 .  ibuprofen (ADVIL) 600 MG tablet, Take 1 tablet (600 mg total) by mouth every 6 (six) hours as needed., Disp: 30 tablet, Rfl: 0 .  methylPREDNISolone (MEDROL DOSEPAK) 4 MG TBPK tablet, Take by mouth  daily. Follow package instructions, Disp: 21 tablet, Rfl: 0 .  fluocinolone (SYNALAR) 0.025 % ointment, Apply topically 2 (two) times daily., Disp: , Rfl:  .  hydrocortisone (ANUSOL-HC) 25 MG suppository, Place 1 suppository (25 mg total) rectally 2 (two) times daily. For 7 days (Patient not taking: Reported on 03/22/2021), Disp: 14 suppository, Rfl: 0 .  levothyroxine (SYNTHROID) 200 MCG tablet, Take 200 mcg by mouth daily before breakfast. (Patient not taking: Reported on 03/22/2021), Disp: , Rfl:  .  SYNTHROID 150 MCG tablet, Take 150 mcg by mouth daily., Disp: , Rfl:  .  tranexamic acid (LYSTEDA) 650 MG TABS tablet, Take 1,300 mg by mouth 3 (three) times daily., Disp: , Rfl:   Allergies  Allergen Reactions  . Doxycycline Anaphylaxis  . Adhesive [Tape] Hives    Hives and itching  . Latex Swelling    Causes swelling, hives and itching  . Minocycline Hypertension    Rash and hives     ROS  As noted in HPI.   Physical Exam  BP 116/78 (BP Location: Right Arm)   Pulse 63   Temp 99.6 F (37.6 C) (Oral)   Resp 16   LMP 02/26/2021 (Exact Date)   SpO2 98%   Constitutional: Well developed, well nourished, no acute distress Eyes:  EOMI, conjunctiva normal bilaterally HENT: Normocephalic, atraumatic,mucus membranes moist Respiratory: Normal inspiratory effort Cardiovascular: Normal rate GI: nondistended skin: No rash, skin intact Musculoskeletal:  L  distal radius  tender, tenderness along the EPB, APB. distal ulnar styloid NT, snuffbox  tender, carpals NT, metacarpals NT, digits NT , TFCC NT.no pain with supination, no pain with pronation, pain with radial / ulnar deviation. Motor intact ability to flex / extend digits , Sensation LT to hand normal RP 2+.Tinel negative Phalen negative.  Finklestein positive.  Elbow and proximal forearm NT Neurologic: Alert & oriented x 3, no focal neuro deficits Psychiatric: Speech and behavior appropriate   ED Course   Medications - No data to  display  Orders Placed This Encounter  Procedures  . Thumb spica    Standing Status:   Standing    Number of Occurrences:   1    Order Specific Question:   Laterality    Answer:   Left    No results found for this or any previous visit (from the past 24 hour(s)). No results found.  ED Clinical Impression  1. De Quervain's tenosynovitis, left      ED Assessment/Plan  Presentation consistent with de Quervain's tenosynovitis left thumb.  Will place in a thumb spica cast, Medrol Dosepak, ibuprofen/Tylenol, follow-up with Dr. Jeannie Fend in [facility], hand on-call if no better in a week with conservative therapy.  Do not think that we need imaging today. Discussed MDM, treatment plan,and plan for follow-up with patient. patient agrees with plan.   Meds ordered this encounter  Medications  . ibuprofen (ADVIL) 600 MG tablet    Sig: Take 1 tablet (600 mg total) by mouth every 6 (six) hours as needed.    Dispense:  30 tablet    Refill:  0  . methylPREDNISolone (MEDROL DOSEPAK) 4 MG TBPK tablet    Sig: Take by mouth daily. Follow package instructions    Dispense:  21 tablet    Refill:  0      *This clinic note was created using Dragon dictation software. Therefore, there may be occasional mistakes despite careful proofreading.  ?    Melynda Ripple, MD 03/24/21 [phone]

## 2021-03-22 NOTE — Discharge Instructions (Addendum)
Wear the thumb spica at all times for the next several weeks.  600 mg of ibuprofen combined with 1000 mg of Tylenol 3-4 times a day as needed for pain.  Finish Medrol Dosepak unless a provider tells you to stop.  Follow-up with Dr. Jeannie Fend if not better in a week

## 2021-03-22 NOTE — ED Triage Notes (Signed)
Pain below left thumb extends toward wrist x 2 months - thought it was nerve damage or sprained  Bruised area appeared yesterday - denies injury  Pain is tender to touch  Ice pack on arrival from home - makes it numb COVID vaccinated x 2 COVID OCT 2020

## 2023-11-04 ENCOUNTER — Ambulatory Visit: Payer: 59

## 2023-11-04 ENCOUNTER — Ambulatory Visit
Admission: EM | Admit: 2023-11-04 | Discharge: 2023-11-04 | Disposition: A | Discharge status: Home / Self Care | Payer: 59 | Attending: Family Medicine | Admitting: Family Medicine

## 2023-11-04 DIAGNOSIS — M79642 Pain in left hand: Secondary | ICD-10-CM | POA: Diagnosis not present

## 2023-11-04 DIAGNOSIS — S60222A Contusion of left hand, initial encounter: Secondary | ICD-10-CM | POA: Diagnosis not present

## 2023-11-04 NOTE — ED Triage Notes (Signed)
[facility] with [facility] of injury to left hand Friday night while at home working out with a punching bag. Pt reports she had gloves on but she injuried her hand and has had swelling and pain in the the left middle and ring finger since. Pt has been icing the area and taken motrin and tylenol.

## 2023-11-04 NOTE — Discharge Instructions (Addendum)
Can ice to reduce pain and swelling Take Aleve or ibuprofen for pain Limit heavy use of hand while it is painful.  No boxing until you can push on these knuckles without hurting See sports medicine if you fail to improve over the next couple of weeks

## 2023-11-04 NOTE — ED Provider Notes (Addendum)
[facility]    CSN: [id number] Arrival date & time: 11/04/23  1732      History   Chief Complaint Chief Complaint  Patient presents with   Hand Injury    HPI Amanda Benitez is a 27 y.o. female.   HPI  Patient states that she is in boxing for exercise.  She states that she punched the bag wrong and felt a pain in her hand.  She was wearing gloves but continues to have pain in her hand.  Left hand is sore and swollen.  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      Home Medications    Prior to Admission medications   Medication Sig Start Date End Date Taking? Authorizing Provider  norethindrone (AYGESTIN) 5 MG tablet Take 5 mg by mouth at bedtime. 10/08/23  Yes [provider]  UNITHROID 125 MCG tablet Take 125 mcg by mouth every morning. 10/17/23  Yes [provider]  Cholecalciferol 25 MCG (1000 UT) capsule 5,000 Units. 02/03/20   [provider]  cyanocobalamin 1000 MCG tablet Take 1,000 mcg by mouth daily.    [provider]  docusate sodium (COLACE) 100 MG capsule Take 1 capsule (100 mg total) by mouth every 12 (twelve) hours. 02/19/20   Gilda Crease, MD  fluocinolone (SYNALAR) 0.025 % ointment Apply topically 2 (two) times daily. 03/01/21   [provider]  hydrocortisone (ANUSOL-HC) 25 MG suppository Place 1 suppository (25 mg total) rectally 2 (two) times daily. For 7 days Patient not taking: Reported on 03/22/2021 02/19/20   Gilda Crease, MD  ibuprofen (ADVIL) 600 MG tablet Take 1 tablet (600 mg total) by mouth every  6 (six) hours as needed. 03/22/21   Domenick Gong, MD  levothyroxine (SYNTHROID) 200 MCG tablet Take 200 mcg by mouth daily before breakfast. Patient not taking: Reported on 03/22/2021    [provider]  methylPREDNISolone (MEDROL DOSEPAK) 4 MG TBPK tablet Take by mouth daily. Follow package instructions 03/22/21   Domenick Gong, MD  SYNTHROID 150 MCG tablet Take 150 mcg by mouth daily. 02/03/21   [provider]  tranexamic acid (LYSTEDA) 650 MG TABS tablet Take 1,300 mg by mouth 3 (three) times daily. 02/28/21   [provider]    Family History Family History  Problem Relation Age of Onset   Hypertension Mother    Glaucoma Mother    Asthma Mother    Hypertension Father    Hyperlipidemia Father    Hyperlipidemia Sister     Social History Social History   Tobacco Use   Smoking status: Never   Smokeless tobacco: Never  Substance Use Topics   Alcohol use: Yes    Comment: rarely   Drug use: No     Allergies   Doxycycline, Adhesive [tape], Latex, and Minocycline   Review of Systems Review of Systems See HPI  Physical Exam Triage Vital Signs ED Triage Vitals  Encounter Vitals Group     BP --11/04/23 127/89      Systolic BP Percentile --  Diastolic BP Percentile --      Pulse Rate 11/04/23 1749 66     Resp 11/04/23 1749 17     Temp 11/04/23 1749 98.4 F (36.9 C)     Temp src --      SpO2 11/04/23 1749 98 %     Weight --      Height --      Head Circumference --      Peak Flow --      Pain Score 11/04/23 1745 4     Pain Loc --      Pain Education --      Exclude from Growth Chart --    No data found.  Updated Vital Signs Pulse 66   Temp 98.4 F (36.9 C)   Resp 17   LMP 10/03/2023 Comment: currently on IVF treatment  SpO2 98%       Physical Exam Constitutional:      General: She is not in acute distress.    Appearance: She is well-developed.  HENT:     Head: Normocephalic and atraumatic.  Eyes:      Conjunctiva/sclera: Conjunctivae normal.     Pupils: Pupils are equal, round, and reactive to light.  Cardiovascular:     Rate and Rhythm: Normal rate.  Pulmonary:     Effort: Pulmonary effort is normal. No respiratory distress.  Abdominal:     General: There is no distension.     Palpations: Abdomen is soft.  Musculoskeletal:        General: Swelling, tenderness and signs of injury present. Normal range of motion.     Cervical back: Normal range of motion.     Comments: Eye swelling across the central portion of the dorsum of the left hand over the MP areas.  The MP c  joints 3 and 4 are swollen.  The fourth is tender.  No deformity.  Good range of motion.  No rotation  Skin:    General: Skin is warm and dry.  Neurological:     Mental Status: She is alert.      UC Treatments / Results  Labs (all labs ordered are listed, but only abnormal results are displayed) Labs Reviewed - No data to display  EKG   Radiology No results found.  Procedures Procedures (including critical care time)  Medications Ordered in UC Medications - No data to display  Initial Impression / Assessment and Plan / UC Course  I have reviewed the triage vital signs and the nursing notes.  Pertinent labs & imaging results that were available during my care of the patient were reviewed by me and considered in my medical decision making (see chart for details).     Patient was discharged prior to x-rays being formally read.  X-rays are delayed today.  To my review they are negative.  Patient is told she will be called if any x-ray abnormalities are identified.  She was also told she can check her x-ray report in MyChart Final Clinical Impressions(s) / UC Diagnoses   Final diagnoses:  Contusion of left hand, initial encounter     Discharge Instructions      Can ice to reduce pain and swelling Take Aleve or ibuprofen for pain Limit heavy use of hand while it is painful.  No boxing until you can  push on these knuckles without hurting See sports medicine if you fail to improve over the next couple of weeks   ED Prescriptions   None  PDMP not reviewed this encounter.   Eustace Moore, MD 11/04/23 Kristopher Oppenheim    Eustace Moore, MD 11/04/23 [phone]

## 2024-07-16 ENCOUNTER — Ambulatory Visit
Admission: RE | Admit: 2024-07-16 | Discharge: 2024-07-16 | Disposition: A | Discharge status: Home / Self Care | Source: Ambulatory Visit | Attending: Family Medicine | Admitting: Family Medicine

## 2024-07-16 ENCOUNTER — Other Ambulatory Visit: Payer: Self-pay

## 2024-07-16 VITALS — BP 113/75 | HR 88 | Temp 98.4°F | Resp 16

## 2024-07-16 DIAGNOSIS — H6991 Unspecified Eustachian tube disorder, right ear: Secondary | ICD-10-CM

## 2024-07-16 NOTE — Discharge Instructions (Signed)
 Try taking Pseudoephedrine (30mg , one or two every 4 to 6 hours) for sinus congestion.   May use Afrin nasal spray (or generic oxymetazoline) each morning for about 5 days and then discontinue.  Also recommend using saline nasal spray several times daily and saline nasal irrigation (AYR is a common brand).  Use Flonase nasal spray each morning after using Afrin nasal spray and saline nasal irrigation.

## 2024-07-16 NOTE — ED Provider Notes (Signed)
 Amanda Benitez    CSN: [id number] Arrival date & time: 07/16/24  1736      History   Chief Complaint Chief Complaint  Patient presents with   Ear Fullness    HPI Amanda Benitez is a 28 y.o. female.   During the past four days patient has experienced muffled hearing in her right ear and mildly decreased hearing.  She can hear her heartbeat in her right ear and has difficulty equalizing pressure in her ears.  She denies ear pain, nasal congestion, URI symptoms, and fever.  She has not been swimming and denies drainage from her ear.  The history is provided by the patient.    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      Home Medications    Prior to Admission medications   Medication Sig Start Date End Date Taking? Authorizing Provider  buPROPion (WELLBUTRIN XL) 150 MG 24 hr tablet Take 150 mg by mouth daily.   Yes [provider]  tirzepatide (ZEPBOUND) 5 MG/0.5ML Pen Inject 5 mg into the skin once a week.   Yes [provider]  Cholecalciferol 25 MCG (1000 UT) capsule 5,000 Units. 02/03/20   [provider]  cyanocobalamin 1000 MCG tablet Take 1,000 mcg by mouth daily.    [provider]  docusate sodium  (COLACE) 100 MG capsule Take 1 capsule (100 mg total) by mouth every 12 (twelve) hours. 02/19/20   Haze Lonni PARAS, MD  fluocinolone (SYNALAR) 0.025 % ointment Apply topically 2 (two) times daily. 03/01/21   [provider]  hydrocortisone  (ANUSOL -HC) 25 MG suppository Place 1 suppository (25 mg total) rectally 2 (two) times daily. For 7 days Patient  not taking: Reported on 03/22/2021 02/19/20   Haze Lonni PARAS, MD  ibuprofen  (ADVIL ) 600 MG tablet Take 1 tablet (600 mg total) by mouth every 6 (six) hours as needed. 03/22/21   Mortenson, Ashley, MD  levothyroxine  (SYNTHROID ) 200 MCG tablet Take 200 mcg by mouth daily before breakfast. Patient not taking: Reported on 03/22/2021    [provider]  methylPREDNISolone  (MEDROL  DOSEPAK) 4 MG TBPK tablet Take by mouth daily. Follow package instructions 03/22/21   Van Knee, MD  norethindrone (AYGESTIN) 5 MG tablet Take 5 mg by mouth at bedtime. 10/08/23   [provider]  SYNTHROID  150 MCG tablet Take 150 mcg by mouth daily. 02/03/21   [provider]  tranexamic acid (LYSTEDA) 650 MG TABS tablet Take 1,300 mg by mouth 3 (three) times daily. 02/28/21   [provider]  UNITHROID  125 MCG tablet Take 125 mcg by mouth every morning. 10/17/23   [provider]    Family History Family History  Problem Relation Age of Onset   Hypertension Mother    Glaucoma Mother    Asthma Mother    Hypertension Father    Hyperlipidemia Father    Hyperlipidemia Sister     Social History Social History   Tobacco Use   Smoking status: Never   Smokeless tobacco: Never  Substance Use Topics   Alcohol use: Yes    Comment: rarely  Drug use: No     Allergies   Doxycycline, Adhesive [tape], Latex, and Minocycline   Review of Systems Review of Systems No sore throat No cough No pleuritic pain No wheezing No nasal congestion No post-nasal drainage No sinus pain/pressure No itchy/red eyes No earache No hemoptysis No SOB No fever/chills No nausea No vomiting No abdominal pain No diarrhea No urinary symptoms No skin rash No fatigue No myalgias No headache   Physical Exam Triage Vital Signs ED Triage Vitals  Encounter Vitals Group     BP 07/16/24 1739 113/75     Girls Systolic BP Percentile --      Girls Diastolic BP Percentile --       Boys Systolic BP Percentile --      Boys Diastolic BP Percentile --      Pulse Rate 07/16/24 1739 88     Resp 07/16/24 1739 16     Temp 07/16/24 1739 98.4 F (36.9 C)     Temp src --      SpO2 07/16/24 1739 99 %     Weight --      Height --      Head Circumference --      Peak Flow --      Pain Score 07/16/24 1745 0     Pain Loc --      Pain Education --      Exclude from Growth Chart --    No data found.  Updated Vital Signs BP 113/75   Pulse 88   Temp 98.4 F (36.9 C)   Resp 16   LMP 06/24/2024 (Exact Date)   SpO2 99%   Visual Acuity Right Eye Distance:   Left Eye Distance:   Bilateral Distance:    Right Eye Near:   Left Eye Near:    Bilateral Near:     Physical Exam Vitals and nursing note reviewed.  Constitutional:      General: She is not in acute distress. HENT:     Head: Normocephalic.     Right Ear: Tympanic membrane, ear canal and external ear normal. There is no impacted cerumen.     Left Ear: Tympanic membrane, ear canal and external ear normal. There is no impacted cerumen.     Nose: Nose normal. No congestion.     Mouth/Throat:     Mouth: Mucous membranes are moist.     Pharynx: Oropharynx is clear.  Eyes:     Conjunctiva/sclera: Conjunctivae normal.     Pupils: Pupils are equal, round, and reactive to light.  Cardiovascular:     Rate and Rhythm: Normal rate.  Pulmonary:     Effort: Pulmonary effort is normal.  Musculoskeletal:     Cervical back: Neck supple.  Lymphadenopathy:     Cervical: No cervical adenopathy.  Skin:    General: Skin is warm and dry.  Neurological:     Mental Status: She is alert.      UC Treatments / Results  Labs (all labs ordered are listed, but only abnormal results are displayed) Labs Reviewed - No data to display  EKG   Radiology No results found.  Procedures Procedures   Tympanometry:  Right ear tympanogram normal; Left ear tympanogram normal  Medications Ordered in UC Medications - No data to  display  Initial Impression / Assessment and Plan / UC Course  I have reviewed the triage vital signs and the nursing notes.  Pertinent labs & imaging results that were available during my  Benitez of the patient were reviewed by me and considered in my medical decision making (see chart for details).    No evidence otitis media or externa.  Tympanogram normal. Followup with  ENT if not improved 2 weeks.  Final Clinical Impressions(s) / UC Diagnoses   Final diagnoses:  Eustachian tube dysfunction, right     Discharge Instructions      Try taking Pseudoephedrine (30mg , one or two every 4 to 6 hours) for sinus congestion.   May use Afrin nasal spray (or generic oxymetazoline) each morning for about 5 days and then discontinue.  Also recommend using saline nasal spray several times daily and saline nasal irrigation (AYR is a common brand).  Use Flonase nasal spray each morning after using Afrin nasal spray and saline nasal irrigation.       ED Prescriptions   None       Pauline Garnette LABOR, MD 07/17/24 [phone]

## 2024-07-16 NOTE — ED Triage Notes (Signed)
 X 4 days has had right ear muffled sensation and can hear heartbeat. No pain. No fever. No otc meds.
